# Patient Record
Sex: Male | Born: 1975 | Race: White | Hispanic: No | Smoking: Former smoker
Health system: Southern US, Community
[De-identification: ages and names within clinical notes are randomized; demographics above are authoritative.]

## PROBLEM LIST (undated history)

## (undated) DIAGNOSIS — I10 Essential (primary) hypertension: Secondary | ICD-10-CM

## (undated) DIAGNOSIS — E785 Hyperlipidemia, unspecified: Secondary | ICD-10-CM

## (undated) DIAGNOSIS — R7303 Prediabetes: Secondary | ICD-10-CM

## (undated) DIAGNOSIS — K219 Gastro-esophageal reflux disease without esophagitis: Secondary | ICD-10-CM

## (undated) DIAGNOSIS — F32A Depression, unspecified: Secondary | ICD-10-CM

## (undated) DIAGNOSIS — F1910 Other psychoactive substance abuse, uncomplicated: Secondary | ICD-10-CM

## (undated) DIAGNOSIS — K921 Melena: Secondary | ICD-10-CM

## (undated) DIAGNOSIS — F419 Anxiety disorder, unspecified: Secondary | ICD-10-CM

## (undated) HISTORY — PX: APPENDECTOMY: SHX54

## (undated) HISTORY — DX: Essential (primary) hypertension: I10

## (undated) HISTORY — DX: Melena: K92.1

## (undated) HISTORY — PX: BOWEL RESECTION: SHX1257

## (undated) HISTORY — PX: HERNIA REPAIR: SHX51

## (undated) HISTORY — DX: Depression, unspecified: F32.A

## (undated) HISTORY — DX: Hyperlipidemia, unspecified: E78.5

## (undated) HISTORY — DX: Gastro-esophageal reflux disease without esophagitis: K21.9

---

## 2008-11-29 LAB — HM HEPATITIS C SCREENING LAB: HM Hepatitis Screen: NEGATIVE

## 2013-10-07 ENCOUNTER — Emergency Department: Payer: Self-pay | Admitting: Emergency Medicine

## 2013-10-07 LAB — COMPREHENSIVE METABOLIC PANEL
ALBUMIN: 4 g/dL (ref 3.4–5.0)
ALT: 33 U/L (ref 12–78)
AST: 35 U/L (ref 15–37)
Alkaline Phosphatase: 116 U/L
Anion Gap: 3 — ABNORMAL LOW (ref 7–16)
BILIRUBIN TOTAL: 0.3 mg/dL (ref 0.2–1.0)
BUN: 14 mg/dL (ref 7–18)
Calcium, Total: 9.5 mg/dL (ref 8.5–10.1)
Chloride: 103 mmol/L (ref 98–107)
Co2: 29 mmol/L (ref 21–32)
Creatinine: 0.97 mg/dL (ref 0.60–1.30)
EGFR (African American): >60
Glucose: 116 mg/dL — ABNORMAL HIGH (ref 65–99)
Osmolality: 272 (ref 275–301)
Potassium: 3.3 mmol/L — ABNORMAL LOW (ref 3.5–5.1)
Sodium: 135 mmol/L — ABNORMAL LOW (ref 136–145)
Total Protein: 9 g/dL — ABNORMAL HIGH (ref 6.4–8.2)

## 2013-10-07 LAB — URINALYSIS, COMPLETE
Blood: NEGATIVE
GLUCOSE, UR: NEGATIVE mg/dL (ref 0–75)
Ketone: NEGATIVE
Leukocyte Esterase: NEGATIVE
Nitrite: NEGATIVE
PH: 5 (ref 4.5–8.0)
Protein: 30
RBC,UR: 4 /HPF (ref 0–5)
Specific Gravity: 1.041 (ref 1.003–1.030)

## 2013-10-07 LAB — CBC WITH DIFFERENTIAL/PLATELET
BASOS ABS: 0.1 10*3/uL (ref 0.0–0.1)
Basophil %: 0.5 %
EOS PCT: 0.4 %
Eosinophil #: 0.1 10*3/uL (ref 0.0–0.7)
HCT: 46.6 % (ref 40.0–52.0)
HGB: 15.5 g/dL (ref 13.0–18.0)
Lymphocyte #: 4.8 10*3/uL — ABNORMAL HIGH (ref 1.0–3.6)
Lymphocyte %: 37.3 %
MCH: 27.2 pg (ref 26.0–34.0)
MCHC: 33.2 g/dL (ref 32.0–36.0)
MCV: 82 fL (ref 80–100)
MONO ABS: 0.8 x10 3/mm (ref 0.2–1.0)
MONOS PCT: 6.5 %
Neutrophil #: 7.1 10*3/uL — ABNORMAL HIGH (ref 1.4–6.5)
Neutrophil %: 55.3 %
Platelet: 369 10*3/uL (ref 150–440)
RBC: 5.68 10*6/uL (ref 4.40–5.90)
RDW: 13.6 % (ref 11.5–14.5)
WBC: 12.9 10*3/uL — ABNORMAL HIGH (ref 3.8–10.6)

## 2013-10-07 LAB — LIPASE, BLOOD: Lipase: 79 U/L (ref 73–393)

## 2013-10-14 ENCOUNTER — Emergency Department: Payer: Self-pay | Admitting: Emergency Medicine

## 2013-10-14 LAB — LIPASE, BLOOD: LIPASE: 218 U/L (ref 73–393)

## 2013-10-14 LAB — URINALYSIS, COMPLETE
BACTERIA: NONE SEEN
Bilirubin,UR: NEGATIVE
Blood: NEGATIVE
GLUCOSE, UR: NEGATIVE mg/dL (ref 0–75)
KETONE: NEGATIVE
LEUKOCYTE ESTERASE: NEGATIVE
Nitrite: NEGATIVE
PH: 7 (ref 4.5–8.0)
Specific Gravity: 1.027 (ref 1.003–1.030)
WBC UR: 12 /HPF (ref 0–5)

## 2013-10-14 LAB — COMPREHENSIVE METABOLIC PANEL
Albumin: 3.9 g/dL (ref 3.4–5.0)
Alkaline Phosphatase: 111 U/L
Anion Gap: 2 — ABNORMAL LOW (ref 7–16)
BUN: 15 mg/dL (ref 7–18)
Bilirubin,Total: 0.3 mg/dL (ref 0.2–1.0)
CHLORIDE: 105 mmol/L (ref 98–107)
CREATININE: 1.04 mg/dL (ref 0.60–1.30)
Calcium, Total: 9.2 mg/dL (ref 8.5–10.1)
Co2: 30 mmol/L (ref 21–32)
EGFR (African American): >60
GLUCOSE: 116 mg/dL — AB (ref 65–99)
Osmolality: 276 (ref 275–301)
Potassium: 3.7 mmol/L (ref 3.5–5.1)
SGOT(AST): 22 U/L (ref 15–37)
SGPT (ALT): 27 U/L (ref 12–78)
Sodium: 137 mmol/L (ref 136–145)
Total Protein: 7.9 g/dL (ref 6.4–8.2)

## 2013-10-14 LAB — CBC WITH DIFFERENTIAL/PLATELET
BASOS ABS: 0.1 10*3/uL (ref 0.0–0.1)
Basophil %: 0.4 %
Eosinophil #: 0.1 10*3/uL (ref 0.0–0.7)
Eosinophil %: 0.4 %
HCT: 43.6 % (ref 40.0–52.0)
HGB: 14.5 g/dL (ref 13.0–18.0)
LYMPHS PCT: 26.3 %
Lymphocyte #: 3.9 10*3/uL — ABNORMAL HIGH (ref 1.0–3.6)
MCH: 27.6 pg (ref 26.0–34.0)
MCHC: 33.2 g/dL (ref 32.0–36.0)
MCV: 83 fL (ref 80–100)
Monocyte #: 0.9 x10 3/mm (ref 0.2–1.0)
Monocyte %: 6.2 %
Neutrophil #: 10 10*3/uL — ABNORMAL HIGH (ref 1.4–6.5)
Neutrophil %: 66.7 %
PLATELETS: 343 10*3/uL (ref 150–440)
RBC: 5.25 10*6/uL (ref 4.40–5.90)
RDW: 13.7 % (ref 11.5–14.5)
WBC: 15 10*3/uL — ABNORMAL HIGH (ref 3.8–10.6)

## 2013-10-25 ENCOUNTER — Ambulatory Visit: Payer: Self-pay | Admitting: Pain Medicine

## 2014-04-21 ENCOUNTER — Emergency Department: Payer: Self-pay | Admitting: Emergency Medicine

## 2014-04-21 LAB — CBC WITH DIFFERENTIAL/PLATELET
BASOS PCT: 0.7 %
Basophil #: 0.1 10*3/uL (ref 0.0–0.1)
EOS ABS: 0 10*3/uL (ref 0.0–0.7)
Eosinophil %: 0.5 %
HCT: 46.1 % (ref 40.0–52.0)
HGB: 15.3 g/dL (ref 13.0–18.0)
Lymphocyte #: 2.5 10*3/uL (ref 1.0–3.6)
Lymphocyte %: 24.4 %
MCH: 28.6 pg (ref 26.0–34.0)
MCHC: 33.1 g/dL (ref 32.0–36.0)
MCV: 86 fL (ref 80–100)
Monocyte #: 0.7 x10 3/mm (ref 0.2–1.0)
Monocyte %: 6.4 %
NEUTROS PCT: 68 %
Neutrophil #: 7.1 10*3/uL — ABNORMAL HIGH (ref 1.4–6.5)
Platelet: 339 10*3/uL (ref 150–440)
RBC: 5.34 10*6/uL (ref 4.40–5.90)
RDW: 14.1 % (ref 11.5–14.5)
WBC: 10.4 10*3/uL (ref 3.8–10.6)

## 2014-04-21 LAB — BASIC METABOLIC PANEL
ANION GAP: 7 (ref 7–16)
BUN: 6 mg/dL — AB (ref 7–18)
CALCIUM: 8.3 mg/dL — AB (ref 8.5–10.1)
CO2: 27 mmol/L (ref 21–32)
CREATININE: 0.95 mg/dL (ref 0.60–1.30)
Chloride: 108 mmol/L — ABNORMAL HIGH (ref 98–107)
EGFR (Non-African Amer.): >60
Glucose: 103 mg/dL — ABNORMAL HIGH (ref 65–99)
OSMOLALITY: 281 (ref 275–301)
Potassium: 3.5 mmol/L (ref 3.5–5.1)
Sodium: 142 mmol/L (ref 136–145)

## 2014-05-20 ENCOUNTER — Emergency Department: Payer: Self-pay | Admitting: Emergency Medicine

## 2014-05-29 ENCOUNTER — Ambulatory Visit: Payer: Self-pay | Admitting: Internal Medicine

## 2015-08-04 IMAGING — CR DG CHEST 2V
1 series · 2 of 2 positions shown · non-contrast
Comparison: 12/04/1995 by report only

CLINICAL DATA: neck pain over last few days worse this am unable to
move neck

EXAM:
CHEST - 2 VIEW

[Series 1: w chest pa · 0.14mm/px · 2 of 2 slices shown]
[im 1/2]
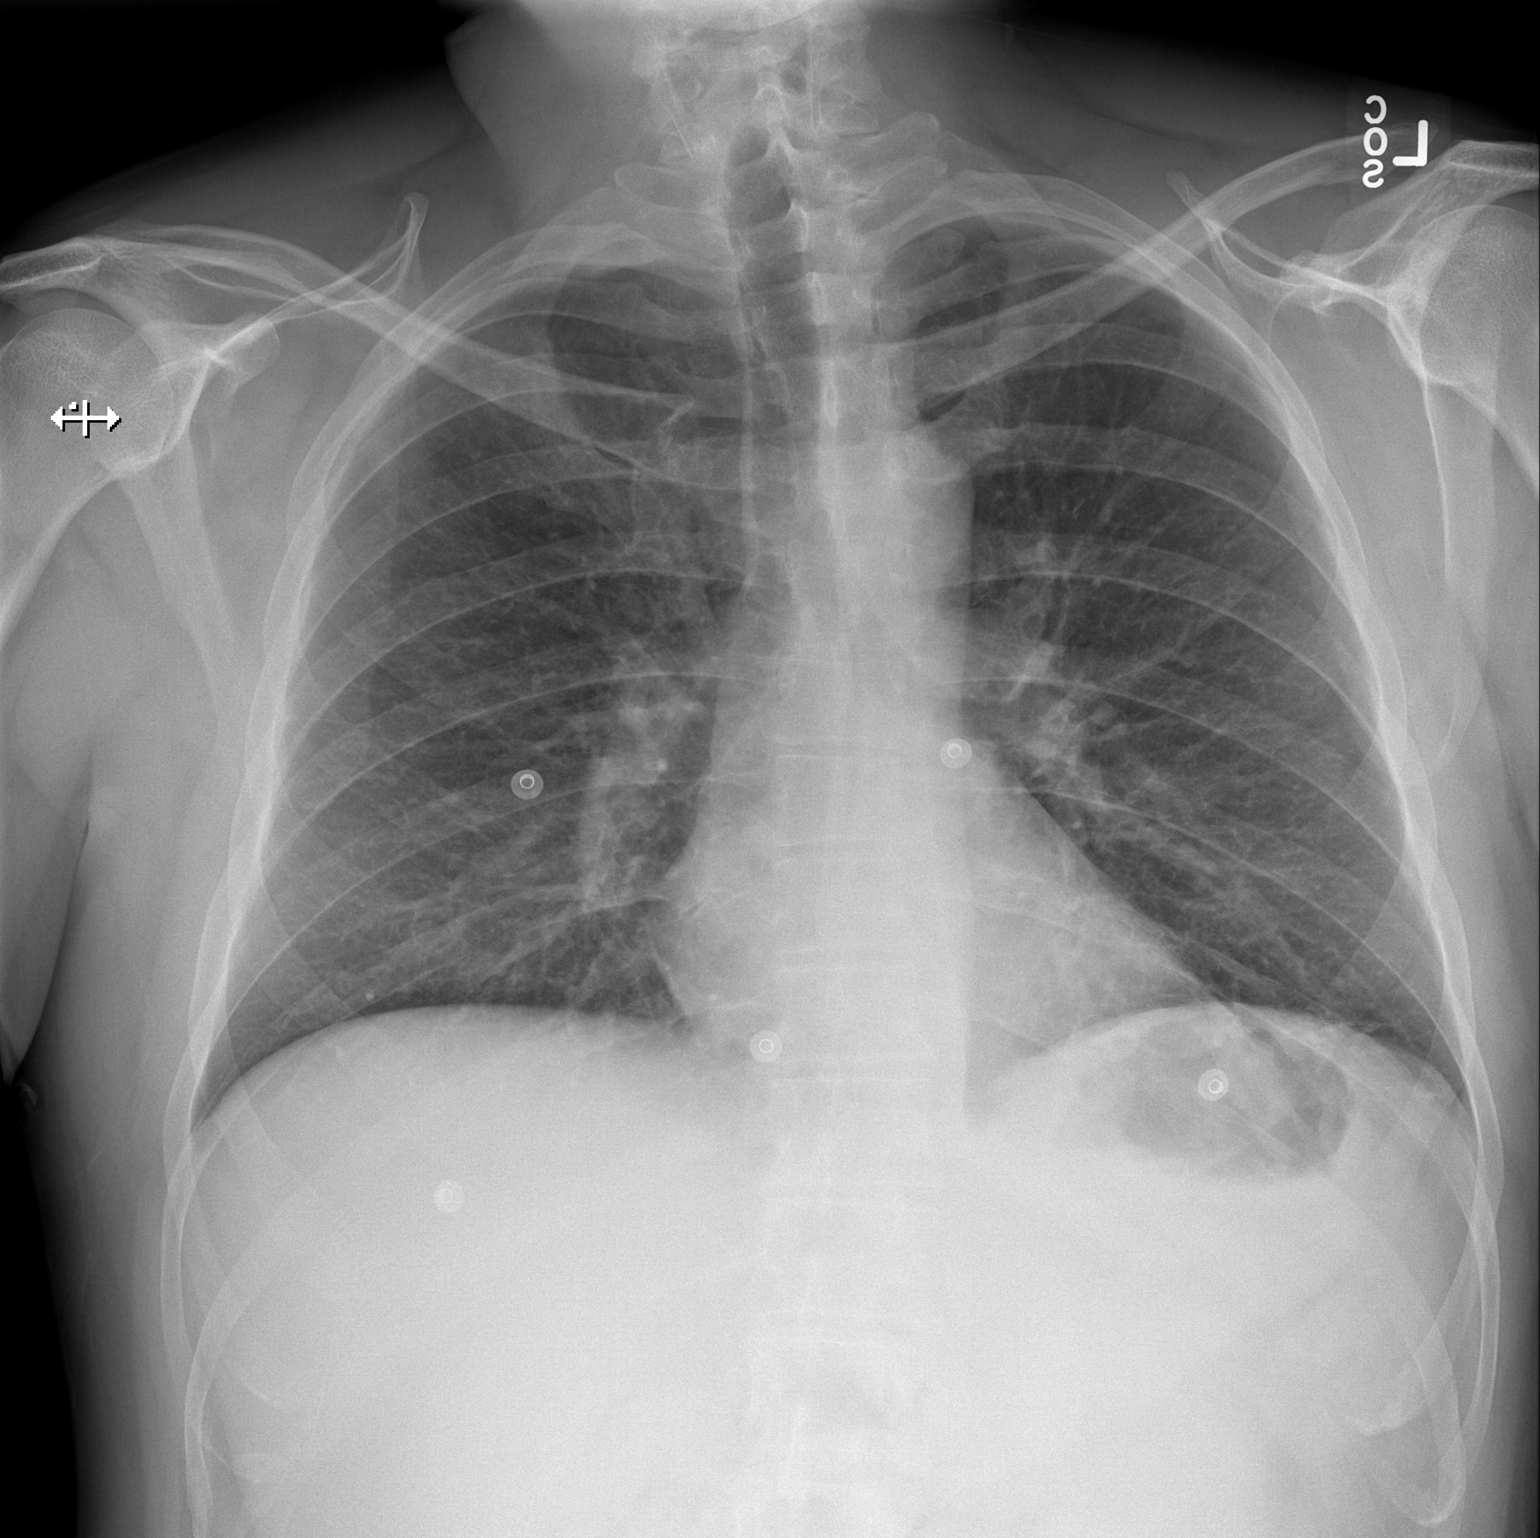
[im 2/2]
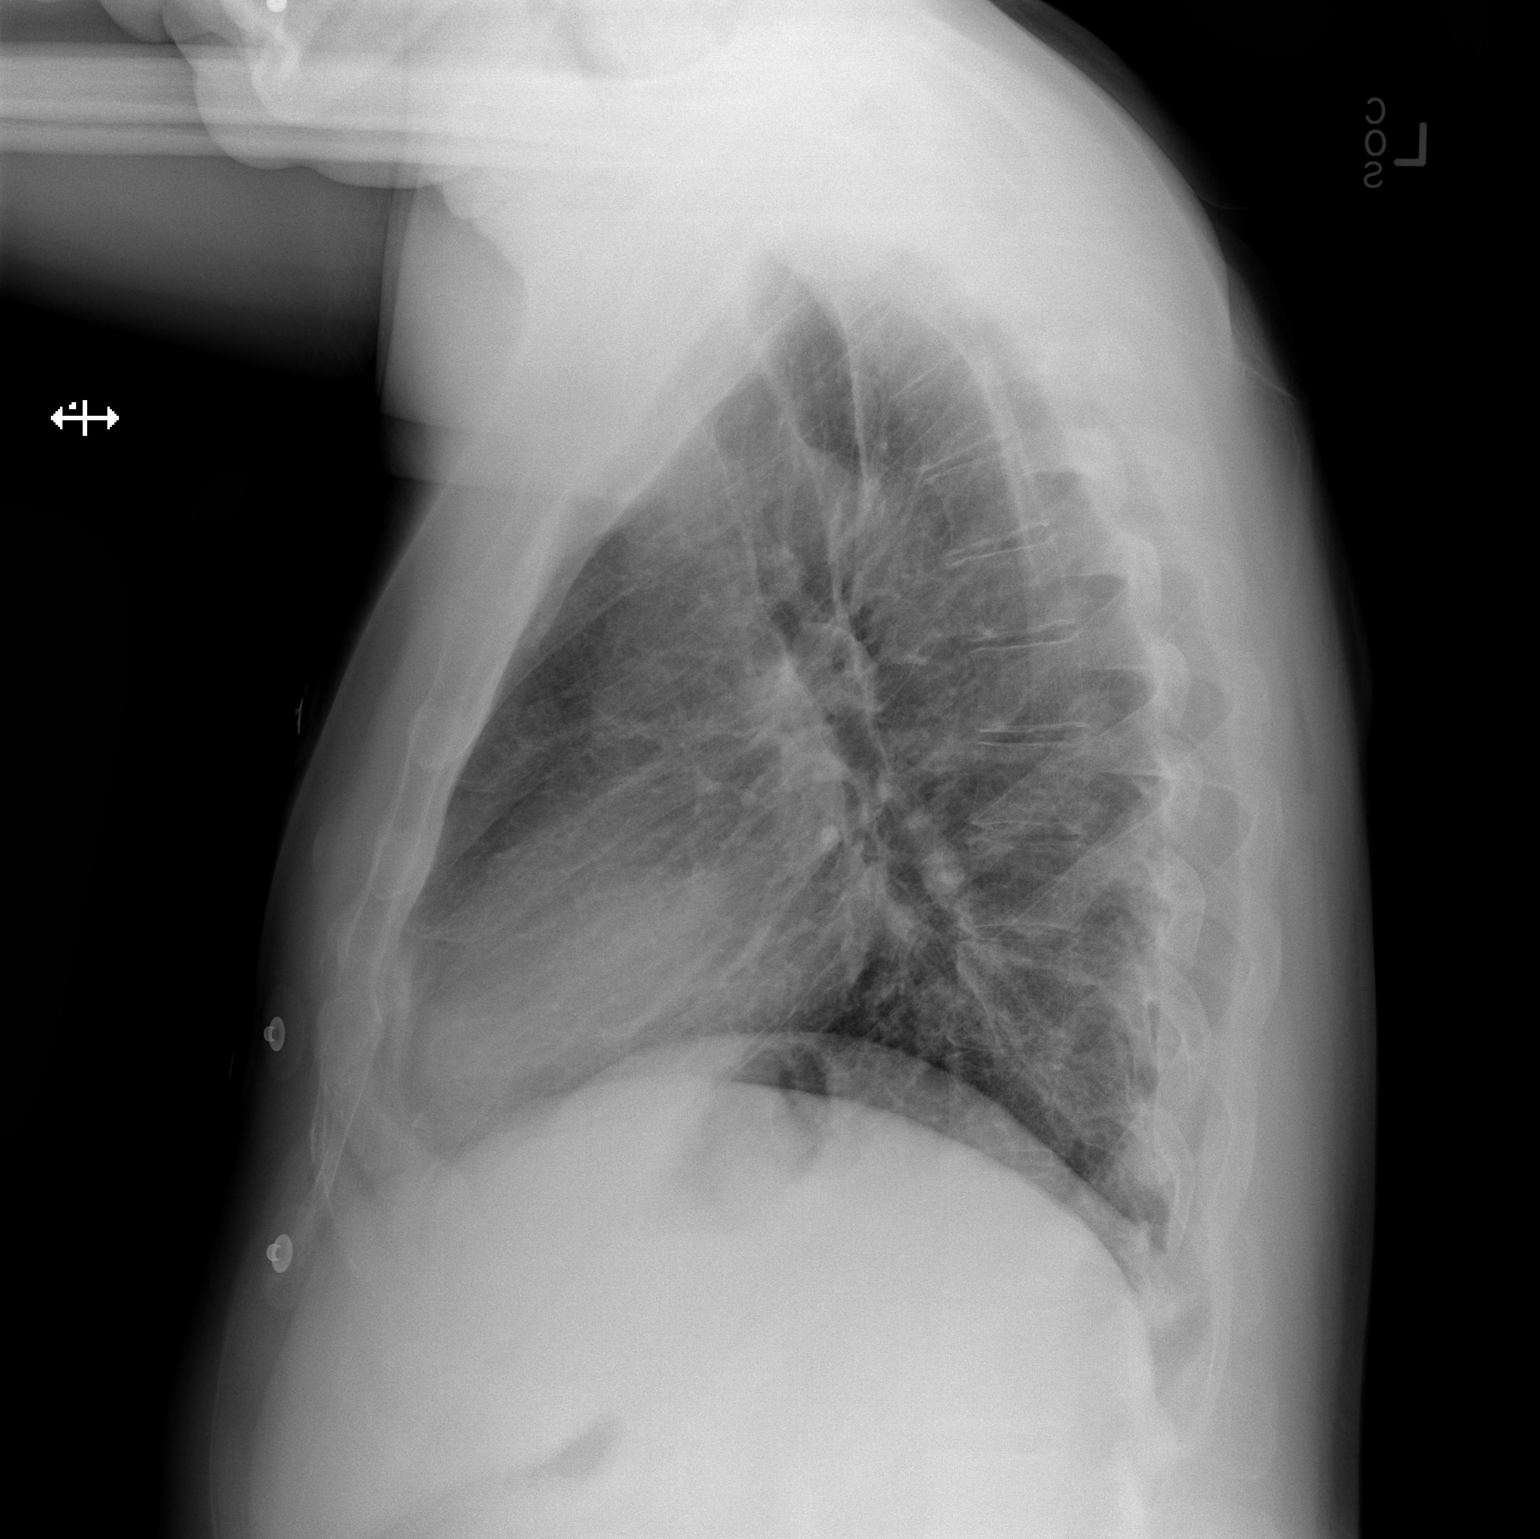

[2 of 2 positions shown; findings below may reference images not displayed]

FINDINGS: Mildly prominent bibasilar interstitial opacities. No focal
infiltrate or overt edema. Heart size normal. No effusion. Regional
bones unremarkable.
IMPRESSION: No acute disease

## 2015-08-04 IMAGING — CT CT CERVICAL SPINE WITHOUT CONTRAST
3 of 4 series · 14 of 34 positions shown, 17 images · non-contrast
Comparison: None.

CLINICAL DATA: Patient c/o pain and inability to move neck this
morning. Reports pain has been going on for a few days, unsure of
any injury. C-collar placed due to inability to move neck and
initial report of injury. pt could not take out tongue ring

EXAM:
CT CERVICAL SPINE WITHOUT CONTRAST
TECHNIQUE: Multidetector CT imaging of the cervical spine was performed without
intravenous contrast. Multiplanar CT image reconstructions were also
generated.

[Series 5: sag bone · sagittal · 0.30mm/px · 5 of 74 slices shown, 6 images]
[im 25/74  bone]
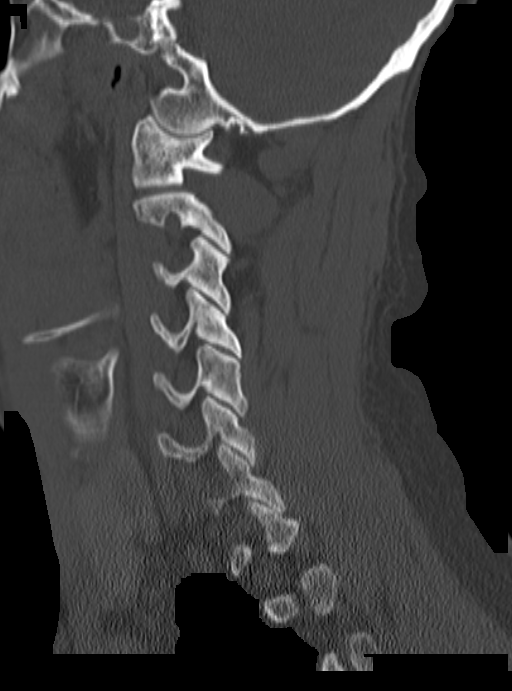
[im 31/74  bone]
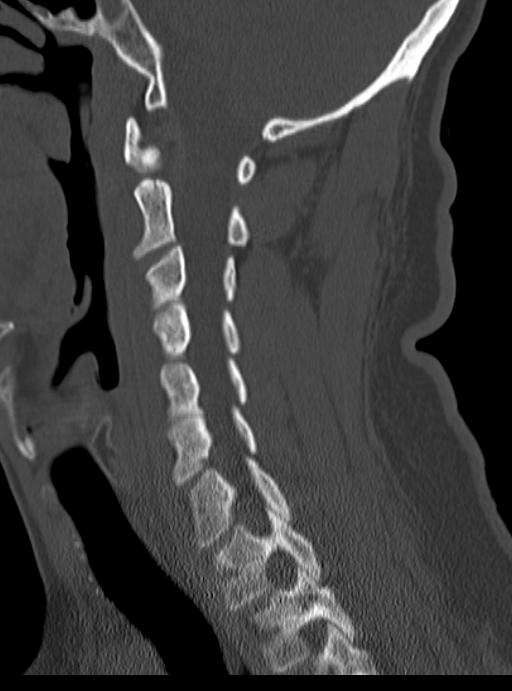
[im 37/74  soft-tissue]
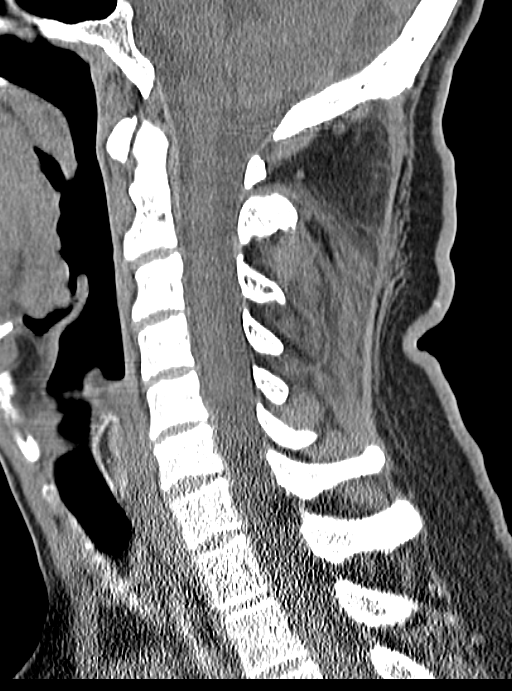
[im 37/74  bone]
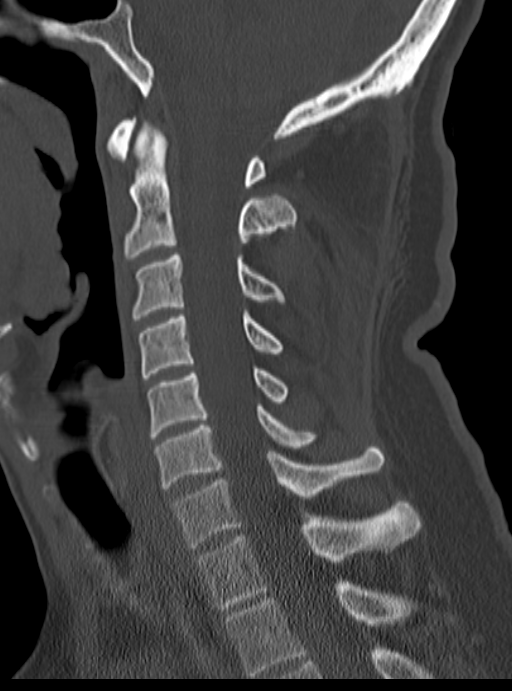
[im 43/74  bone]
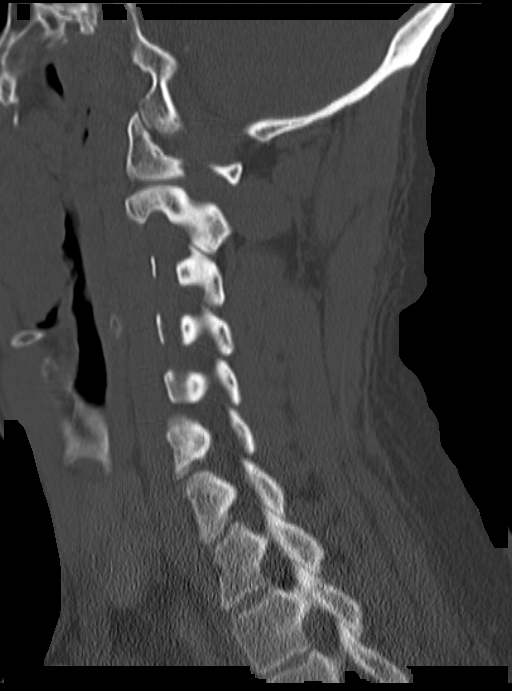
[im 49/74  bone]
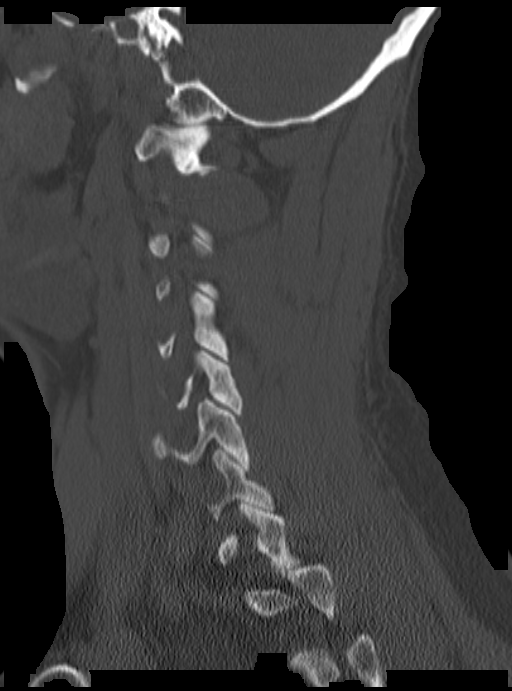

[Series 6: cor bone · coronal · 0.32mm/px · 3 of 75 slices shown]
[im 15/75  bone]
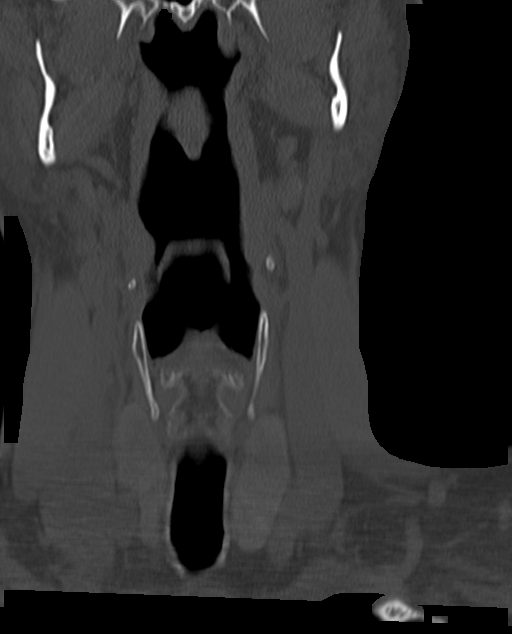
[im 30/75  bone]
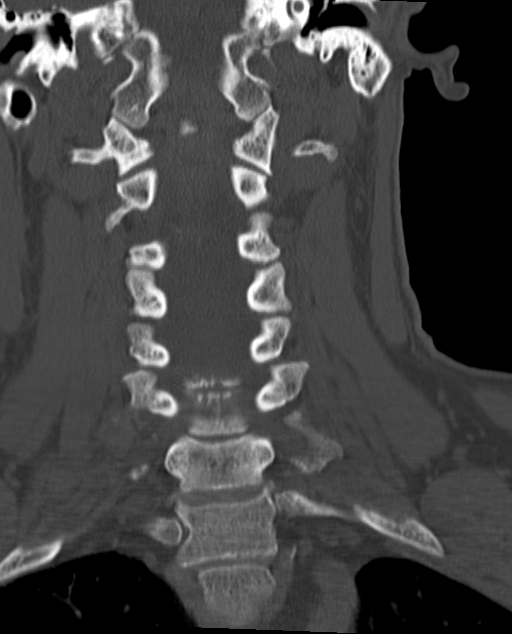
[im 45/75  bone]
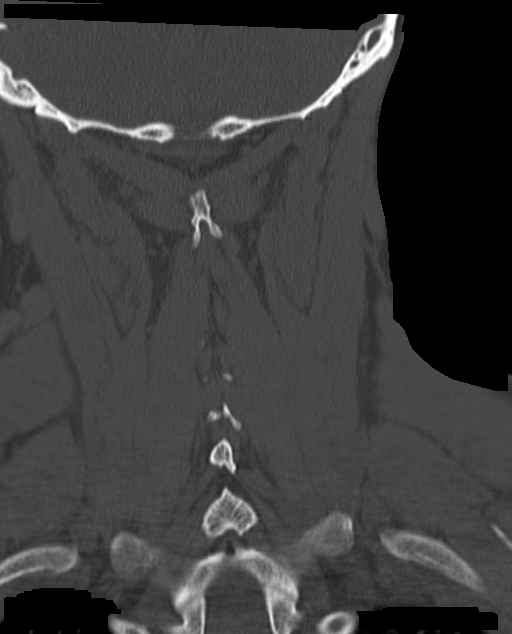

[Series 7: orthogonal axials · axial · 0.29mm/px · z∈[-216,-51]mm · 6 of 118 slices shown, 8 images]
[im 17/118  soft-tissue]
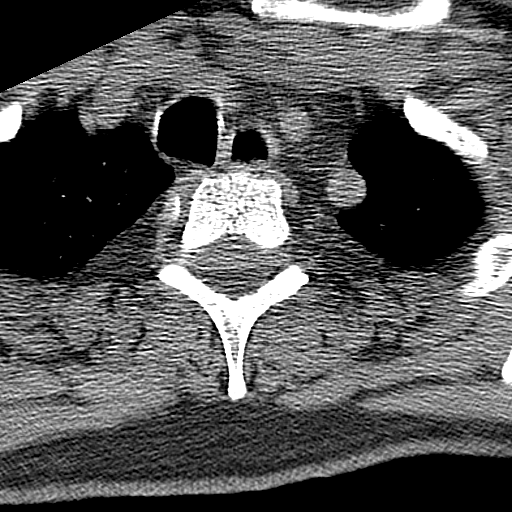
[im 17/118  bone]
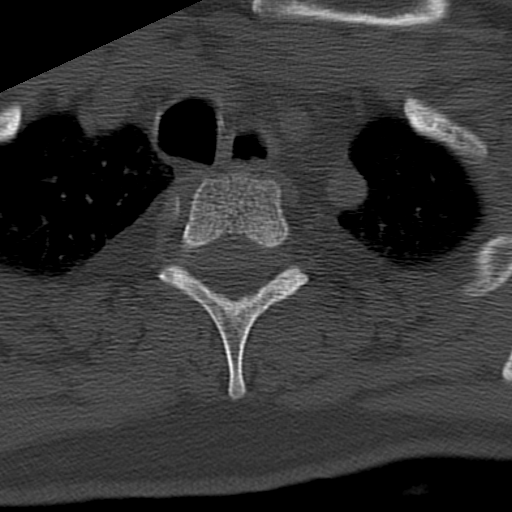
[im 34/118  bone]
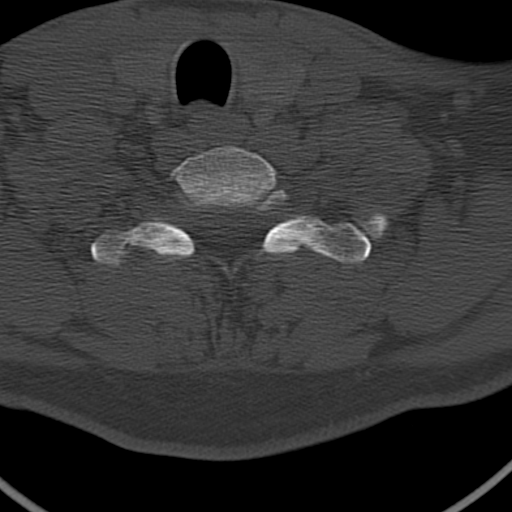
[im 51/118  bone]
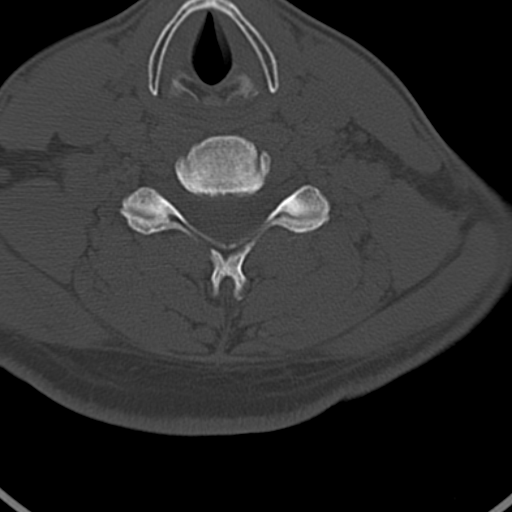
[im 67/118  bone]
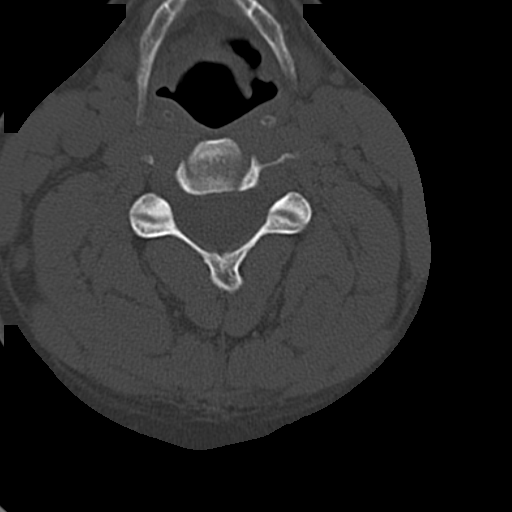
[im 84/118  soft-tissue]
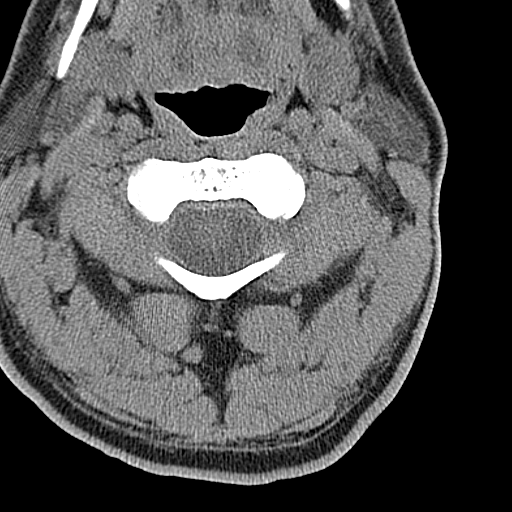
[im 84/118  bone]
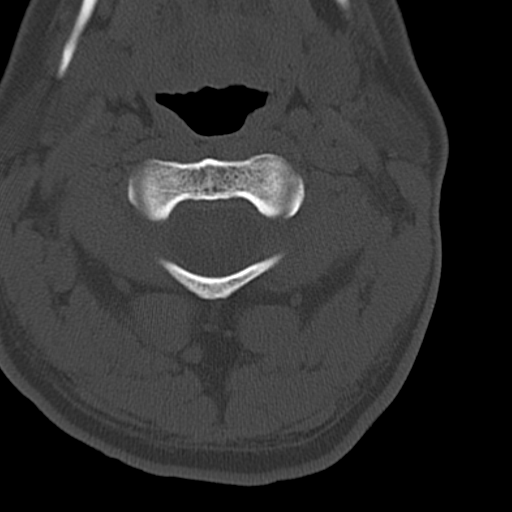
[im 101/118  bone]
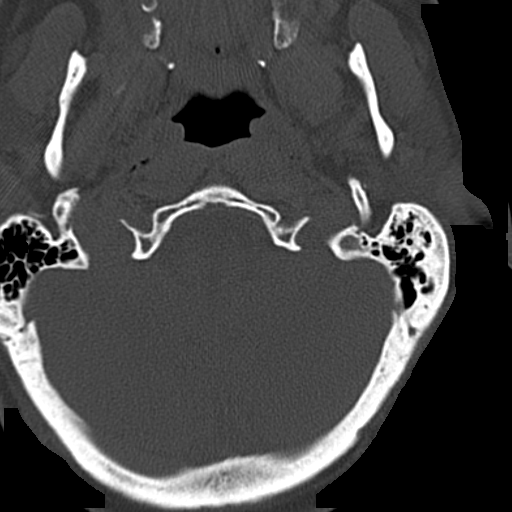

[14 of 34 positions shown; findings below may reference images not displayed]

FINDINGS: Normal alignment. Vertebral body height maintained. Mild narrowing
of the C5-6 disc with small bulge. Facets are seated. Negative for
fracture. Visualized lung apices clear. No significant osseous
degenerative change.
IMPRESSION: 1. Negative for fracture or other acute abnormality.
2. Early degenerative disc disease C5-6.

## 2018-06-15 LAB — HM COLONOSCOPY

## 2020-07-17 LAB — LIPID PANEL
Cholesterol: 170 (ref 0–200)
HDL: 38 (ref 35–70)
LDL Cholesterol: 111
Triglycerides: 103 (ref 40–160)

## 2022-04-27 ENCOUNTER — Other Ambulatory Visit: Payer: Self-pay

## 2022-04-27 ENCOUNTER — Encounter (HOSPITAL_COMMUNITY): Payer: Self-pay

## 2022-04-27 ENCOUNTER — Emergency Department (HOSPITAL_COMMUNITY): Payer: Medicare Other

## 2022-04-27 ENCOUNTER — Emergency Department (HOSPITAL_COMMUNITY)
Admission: EM | Admit: 2022-04-27 | Discharge: 2022-04-27 | Disposition: A | Discharge status: Home / Self Care | Payer: Medicare Other | Attending: Emergency Medicine | Admitting: Emergency Medicine

## 2022-04-27 DIAGNOSIS — R112 Nausea with vomiting, unspecified: Secondary | ICD-10-CM | POA: Insufficient documentation

## 2022-04-27 DIAGNOSIS — R824 Acetonuria: Secondary | ICD-10-CM | POA: Insufficient documentation

## 2022-04-27 DIAGNOSIS — N281 Cyst of kidney, acquired: Secondary | ICD-10-CM

## 2022-04-27 DIAGNOSIS — R1033 Periumbilical pain: Secondary | ICD-10-CM | POA: Insufficient documentation

## 2022-04-27 DIAGNOSIS — D72829 Elevated white blood cell count, unspecified: Secondary | ICD-10-CM | POA: Insufficient documentation

## 2022-04-27 DIAGNOSIS — E876 Hypokalemia: Secondary | ICD-10-CM | POA: Insufficient documentation

## 2022-04-27 DIAGNOSIS — R109 Unspecified abdominal pain: Secondary | ICD-10-CM | POA: Diagnosis present

## 2022-04-27 DIAGNOSIS — N433 Hydrocele, unspecified: Secondary | ICD-10-CM

## 2022-04-27 LAB — CBC WITH DIFFERENTIAL/PLATELET
Abs Immature Granulocytes: 0.05 10*3/uL (ref 0.00–0.07)
Basophils Absolute: 0 10*3/uL (ref 0.0–0.1)
Basophils Relative: 0 %
Eosinophils Absolute: 0 10*3/uL (ref 0.0–0.5)
Eosinophils Relative: 0 %
HCT: 49 % (ref 39.0–52.0)
Hemoglobin: 16.4 g/dL (ref 13.0–17.0)
Immature Granulocytes: 0 %
Lymphocytes Relative: 15 %
Lymphs Abs: 2.1 10*3/uL (ref 0.7–4.0)
MCH: 28.6 pg (ref 26.0–34.0)
MCHC: 33.5 g/dL (ref 30.0–36.0)
MCV: 85.5 fL (ref 80.0–100.0)
Monocytes Absolute: 0.5 10*3/uL (ref 0.1–1.0)
Monocytes Relative: 4 %
Neutro Abs: 11.2 10*3/uL — ABNORMAL HIGH (ref 1.7–7.7)
Neutrophils Relative %: 81 %
Platelets: 336 10*3/uL (ref 150–400)
RBC: 5.73 MIL/uL (ref 4.22–5.81)
RDW: 12.1 % (ref 11.5–15.5)
WBC: 13.9 10*3/uL — ABNORMAL HIGH (ref 4.0–10.5)
nRBC: 0 % (ref 0.0–0.2)

## 2022-04-27 LAB — COMPREHENSIVE METABOLIC PANEL
ALT: 31 U/L (ref 0–44)
AST: 36 U/L (ref 15–41)
Albumin: 4.3 g/dL (ref 3.5–5.0)
Alkaline Phosphatase: 81 U/L (ref 38–126)
Anion gap: 14 (ref 5–15)
BUN: 14 mg/dL (ref 6–20)
CO2: 25 mmol/L (ref 22–32)
Calcium: 9.8 mg/dL (ref 8.9–10.3)
Chloride: 98 mmol/L (ref 98–111)
Creatinine, Ser: 0.97 mg/dL (ref 0.61–1.24)
GFR, Estimated: >60 mL/min (ref >60)
Glucose, Bld: 113 mg/dL — ABNORMAL HIGH (ref 70–99)
Potassium: 3.3 mmol/L — ABNORMAL LOW (ref 3.5–5.1)
Sodium: 137 mmol/L (ref 135–145)
Total Bilirubin: 1 mg/dL (ref 0.3–1.2)
Total Protein: 8.1 g/dL (ref 6.5–8.1)

## 2022-04-27 LAB — URINALYSIS, ROUTINE W REFLEX MICROSCOPIC
Bacteria, UA: NONE SEEN
Bilirubin Urine: NEGATIVE
Glucose, UA: NEGATIVE mg/dL
Ketones, ur: 80 mg/dL — AB
Leukocytes,Ua: NEGATIVE
Nitrite: NEGATIVE
Protein, ur: NEGATIVE mg/dL
Specific Gravity, Urine: 1.017 (ref 1.005–1.030)
pH: 5 (ref 5.0–8.0)

## 2022-04-27 LAB — LIPASE, BLOOD: Lipase: 22 U/L (ref 11–51)

## 2022-04-27 LAB — MAGNESIUM: Magnesium: 2 mg/dL (ref 1.7–2.4)

## 2022-04-27 MED ORDER — POTASSIUM CHLORIDE CRYS ER 20 MEQ PO TBCR: 20.0000 meq | EXTENDED_RELEASE_TABLET | Freq: Two times a day (BID) | ORAL | 0 refills | Status: DC

## 2022-04-27 MED ORDER — POTASSIUM CHLORIDE CRYS ER 20 MEQ PO TBCR
40.0000 meq | EXTENDED_RELEASE_TABLET | Freq: Once | ORAL | Status: AC
  Administered 2022-04-27: 40 meq via ORAL
  Filled 2022-04-27: qty 2

## 2022-04-27 MED ORDER — PROMETHAZINE HCL 25 MG PO TABS: 25.0000 mg | ORAL_TABLET | Freq: Four times a day (QID) | ORAL | 0 refills | Status: DC | PRN

## 2022-04-27 MED ORDER — OXYCODONE-ACETAMINOPHEN 5-325 MG PO TABS: 1.0000 | ORAL_TABLET | Freq: Four times a day (QID) | ORAL | 0 refills | Status: DC | PRN

## 2022-04-27 MED ORDER — MORPHINE SULFATE (PF) 4 MG/ML IV SOLN
4.0000 mg | Freq: Once | INTRAVENOUS | Status: AC
  Administered 2022-04-27: 4 mg via INTRAVENOUS
  Filled 2022-04-27: qty 1

## 2022-04-27 MED ORDER — OXYCODONE-ACETAMINOPHEN 5-325 MG PO TABS
1.0000 | ORAL_TABLET | Freq: Once | ORAL | Status: AC
  Administered 2022-04-27: 1 via ORAL
  Filled 2022-04-27: qty 1

## 2022-04-27 MED ORDER — SODIUM CHLORIDE 0.9 % IV BOLUS
500.0000 mL | Freq: Once | INTRAVENOUS | Status: AC
  Administered 2022-04-27: 500 mL via INTRAVENOUS

## 2022-04-27 NOTE — ED Provider Triage Note (Signed)
Emergency Medicine Provider Triage Evaluation Note  Wyatt Murray , a 46 y.o. male  was evaluated in triage.  Pt complains of abdominal pain that began approximately 3 days ago.  States that symptoms can insidiously upon awakening.  He notes exacerbation of symptoms with any movement denies any alleviating symptoms.  He has noted 2 episodes of vomiting and constipation nausea since symptom onset.  He has not had a bowel movement in 2 days but states that is normal for him.  Has a reported history of multiple abdominal surgeries including 2 partial bowel resections and a few hernia repairs.  He denies hematochezia, melena, hematemesis, fever, chest pain, shortness of breath, urinary symptoms, back/flank pain.  Review of Systems  Positive: See above Negative:   Physical Exam  BP (!) 142/108 (BP Location: Right Arm)   Pulse (!) 113   Temp 98.1 F (36.7 C) (Oral)   Resp 18   Ht 6' (1.829 m)   Wt 86.2 kg   SpO2 98%   BMI 25.77 kg/m  Gen:   Awake, no distress   Resp:  Normal effort  MSK:   Moves extremities without difficulty  Other:  Epigastric/periumbilical tenderness.  No CVA tenderness bilaterally.  Medical Decision Making  Medically screening exam initiated at 9:34 AM.  Appropriate orders placed.  LANI MENDIOLA was informed that the remainder of the evaluation will be completed by another provider, this initial triage assessment does not replace that evaluation, and the importance of remaining in the ED until their evaluation is complete.    Wilnette Kales, [place] 04/27/22 [phone]

## 2022-04-27 NOTE — ED Triage Notes (Signed)
Pt arrived POV from home c/o abdominal pain more on the left side of his abdomen that is tender to palpation. Pt endorses N/V as well.

## 2022-04-27 NOTE — Discharge Instructions (Addendum)
Note that your work-up today was overall negative for direct cause of your abdominal pain.  I suspect postsurgical changes including adhesions given area where you are most tender.  I refill antiemetic to use as needed as well as give a short course of pain medication to use as needed.  Your potassium was also low while emergency department I will prescribe 5 days worth of potassium to take daily.  Keep your upcoming appointment with your general surgeon for reevaluation of your symptoms.  Please do not hesitate to return to the emergency department if the worrisome signs and symptoms we discussed become apparent.

## 2022-04-27 NOTE — ED Provider Notes (Signed)
[facility] EMERGENCY DEPARTMENT Provider Note   CSN: [id number] Arrival date & time: 04/27/22  [id number]     History  Chief Complaint  Patient presents with   Abdominal Pain    Wyatt Murray is a 46 y.o. male.   Abdominal Pain  46 year old male presents emergency department with complaints of abdominal pain that began approximately 3 days ago.  States that symptoms can insidiously upon awakening.  He notes exacerbation of symptoms with any movement denies any alleviating symptoms.  He has noted 2 episodes of vomiting and constipation nausea since symptom onset.  He has not had a bowel movement in 2 days but states that is normal for him.  Has a reported history of multiple abdominal surgeries including 2 partial bowel resections and a few hernia repairs.  He denies hematochezia, melena, hematemesis, fever, chest pain, shortness of breath, urinary symptoms, back/flank pain.  Home Medications Prior to Admission medications   Medication Sig Start Date End Date Taking? Authorizing Provider  oxyCODONE-acetaminophen (PERCOCET/ROXICET) 5-325 MG tablet Take 1 tablet by mouth every 6 (six) hours as needed for severe pain. 04/27/22  Yes Dion Saucier A, PA  potassium chloride SA (KLOR-CON M) 20 MEQ tablet Take 1 tablet (20 mEq total) by mouth 2 (two) times daily. 04/27/22  Yes Dion Saucier A, PA  promethazine (PHENERGAN) 25 MG tablet Take 1 tablet (25 mg total) by mouth every 6 (six) hours as needed for nausea or vomiting. 04/27/22  Yes Dion Saucier A, PA      Allergies    Iodinated contrast media, Iodine, Midazolam, Shellfish allergy, and Atorvastatin    Review of Systems   Review of Systems  Gastrointestinal:  Positive for abdominal pain.    Physical Exam Updated Vital Signs BP 115/78   Pulse 79   Temp 98 F (36.7 C) (Oral)   Resp 17   Ht 6' (1.829 m)   Wt 86.2 kg   SpO2 98%   BMI 25.77 kg/m  Physical Exam Vitals and nursing note reviewed.   Constitutional:      General: He is not in acute distress.    Appearance: He is well-developed.  HENT:     Head: Normocephalic and atraumatic.  Eyes:     Conjunctiva/sclera: Conjunctivae normal.  Cardiovascular:     Rate and Rhythm: Normal rate and regular rhythm.     Heart sounds: No murmur heard. Pulmonary:     Effort: Pulmonary effort is normal. No respiratory distress.     Breath sounds: Normal breath sounds.  Abdominal:     Palpations: Abdomen is soft.     Tenderness: There is abdominal tenderness in the periumbilical area. There is guarding. There is no right CVA tenderness, left CVA tenderness or rebound.     Comments: Midline incision on patient's abdomen nonerythematous.   Musculoskeletal:        General: No swelling.     Cervical back: Neck supple.  Skin:    General: Skin is warm and dry.     Capillary Refill: Capillary refill takes less than 2 seconds.  Neurological:     Mental Status: He is alert.  Psychiatric:        Mood and Affect: Mood normal.     ED Results / Procedures / Treatments   Labs (all labs ordered are listed, but only abnormal results are displayed) Labs Reviewed  CBC WITH DIFFERENTIAL/PLATELET - Abnormal; Notable for the following components:      Result Value   WBC  13.9 (*)    Neutro Abs 11.2 (*)    All other components within normal limits  COMPREHENSIVE METABOLIC PANEL - Abnormal; Notable for the following components:   Potassium 3.3 (*)    Glucose, Bld 113 (*)    All other components within normal limits  URINALYSIS, ROUTINE W REFLEX MICROSCOPIC - Abnormal; Notable for the following components:   Hgb urine dipstick MODERATE (*)    Ketones, ur 80 (*)    All other components within normal limits  LIPASE, BLOOD  MAGNESIUM    EKG None  Radiology CT Abdomen Pelvis Wo Contrast  Result Date: 04/27/2022 CLINICAL DATA:  Abdominal pain worse on the left side with tenderness to palpation. Nausea, vomiting. EXAM: CT ABDOMEN AND PELVIS  WITHOUT CONTRAST TECHNIQUE: Multidetector CT imaging of the abdomen and pelvis was performed following the standard protocol without IV contrast. RADIATION DOSE REDUCTION: This exam was performed according to the departmental dose-optimization program which includes automated exposure control, adjustment of the mA and/or kV according to patient size and/or use of iterative reconstruction technique. COMPARISON:  CT abdomen/pelvis 10/14/2013 FINDINGS: Lower chest: The lung bases are clear. The imaged heart is unremarkable. Hepatobiliary: The liver and gallbladder are unremarkable. There is no biliary ductal dilatation. Pancreas: Unremarkable. Spleen: Unremarkable. Adrenals/Urinary Tract: The adrenals are unremarkable. There is a 1.1 cm hypodense partially exophytic lesion arising from the right kidney upper pole which was not present in 2015. No other definite focal lesions are seen within the confines of noncontrast technique. There are no stones in either kidney or along the course of either ureter. There is no hydronephrosis or hydroureter. There is perinephric stranding bilaterally, nonspecific. The bladder is unremarkable. Stomach/Bowel: Stomach is unremarkable. There is no evidence of bowel obstruction. There is no abnormal bowel wall thickening or inflammatory changes postsurgical changes are noted in the small bowel in the mid abdomen, stable. The appendix is not identified, but there is no pericecal inflammatory change. Vascular/Lymphatic: The abdominal aorta is normal in course and caliber. There is no abdominopelvic lymphadenopathy. Reproductive: The prostate and seminal vesicles are unremarkable. Left larger than right scrotal hydroceles are seen. Other: There is no ascites or free air. Postsurgical changes along the anterior abdominal wall likely reflect prior hernia repair. Musculoskeletal: There is no acute osseous abnormality or suspicious osseous lesion. IMPRESSION: 1. No acute finding in the abdomen  or pelvis. 2. Partially exophytic lesion arising from the right kidney most likely reflects a benign cyst but is technically indeterminate on this study. Recommend nonemergent renal ultrasound for initial further evaluation. 3. Left larger than right scrotal hydroceles. Electronically Signed   By: Valetta Mole M.D.   On: 04/27/2022 13:59    Procedures Procedures    Medications Ordered in ED Medications  oxyCODONE-acetaminophen (PERCOCET/ROXICET) 5-325 MG per tablet 1 tablet (1 tablet Oral Given 04/27/22 1013)  morphine (PF) 4 MG/ML injection 4 mg (4 mg Intravenous Given 04/27/22 1233)  potassium chloride SA (KLOR-CON M) CR tablet 40 mEq (40 mEq Oral Given 04/27/22 1231)  sodium chloride 0.9 % bolus 500 mL (0 mLs Intravenous Stopped 04/27/22 1303)    ED Course/ Medical Decision Making/ A&P Clinical Course as of 04/27/22 1423  Tue Apr 27, 2022  1330 Patient states he had an anaphylactic allergy to the IV contrast used during prior CT imaging 15 or so years ago.  Offered premedication before contrast and he declined. [CR]    Clinical Course User Index [CR] Wilnette Kales, [place]  Medical Decision Making Amount and/or Complexity of Data Reviewed Labs: ordered. Radiology: ordered.  Risk Prescription drug management.   This patient presents to the ED for concern of abdominal pain, this involves an extensive number of treatment options, and is a complaint that carries with it a high risk of complications and morbidity.  The differential diagnosis includes The causes of generalized abdominal pain include but are not limited to AAA, mesenteric ischemia, appendicitis, diverticulitis, DKA, gastritis, gastroenteritis, AMI, nephrolithiasis, pancreatitis, peritonitis, adrenal insufficiency,lead poisoning, iron toxicity, intestinal ischemia, constipation, UTI,SBO/LBO, splenic rupture, biliary disease, IBD, IBS, PUD, or hepatitis.   Co morbidities that complicate the patient  evaluation  Prior abdominal surgery, substance abuse, chronic tobacco use.   Additional history obtained:  Additional history obtained from EMR External records from outside source obtained and reviewed including surgical procedure from 10/13/2019 indicating an elective recurrent incisional hernia repair.  Prior abdominal surgeries not visible   Lab Tests:  I Ordered, and personally interpreted labs.  The pertinent results include: Potassium 3.3 which supplemented orally with 40 mEq of potassium.  Leukocytosis of 13.9 with 11.2 neutrophil.  Lipase normal, urine significant for 80 ketones as well as moderate hemoglobin; no signs of infection or gross blood noted.   Imaging Studies ordered:  I ordered imaging studies including CT abdomen pelvis I independently visualized and interpreted imaging which showed no acute findings.  Partially exophytic lesion arising from the right kidney most likely a benign cyst.  Larger left and right scrotal hydrocele; not active complaining of testicular or scrotal pain. I agree with the radiologist interpretation  Cardiac Monitoring: / EKG:  The patient was maintained on a cardiac monitor.  I personally viewed and interpreted the cardiac monitored which showed an underlying rhythm of: Sinus rhythm   Consultations Obtained:  N/a   Problem List / ED Course / Critical interventions / Medication management  Abdominal pain I ordered medication including Percocet and morphine for pain.  500 mL of normal saline for rehydration f Reevaluation of the patient after these medicines showed that the patient improved I have reviewed the patients home medicines and have made adjustments as needed   Social Determinants of Health:  Patient has history of substance abuse but is on Suboxone currently.  Denies alcohol or illicit drug use.   Test / Admission - Considered:  Abdominal pain Vitals signs within normal range and stable throughout  visit. Laboratory/imaging studies significant for: See above Patient's symptoms was likely related to postsurgical surgical change in abdomen given area of most tenderness.  CT abdomen pelvis negative for any acute abnormality.  I suspect adhesive pathology given location of patient's symptoms as well as lack of findings on CT imaging.  Patient has close follow-up with general surgeon on August 17 who is performed prior abdominal surgeries.  Short-term of pain medication will be prescribed as well as refill on patient's antiemetic to use as needed.  Doubt appendicitis.  Doubt pancreatitis.  Doubt mesenteric ischemia.  Doubt diverticulitis. Worrisome signs and symptoms were discussed with the patient, and the patient acknowledged understanding to return to the ED if noticed. Patient was stable upon discharge.         Final Clinical Impression(s) / ED Diagnoses Final diagnoses:  Periumbilical abdominal pain    Rx / DC Orders ED Discharge Orders          Ordered    promethazine (PHENERGAN) 25 MG tablet  Every 6 hours PRN        04/27/22 1422  oxyCODONE-acetaminophen (PERCOCET/ROXICET) 5-325 MG tablet  Every 6 hours PRN        04/27/22 1422    potassium chloride SA (KLOR-CON M) 20 MEQ tablet  2 times daily        04/27/22 1422              Wilnette Kales, [place] 04/27/22 1423    Oneal Deputy K, DO 04/27/22 1641

## 2022-04-27 NOTE — ED Notes (Signed)
Pt transported to CT ?

## 2022-04-27 NOTE — ED Notes (Signed)
Pt returned from CT °

## 2022-04-27 NOTE — ED Notes (Signed)
Pt verbalizes understanding of discharge instructions. Opportunity for questions and answers were provided. Pt discharged from the ED.   ?

## 2022-05-26 ENCOUNTER — Other Ambulatory Visit: Payer: Self-pay | Admitting: Surgery

## 2022-05-26 DIAGNOSIS — R109 Unspecified abdominal pain: Secondary | ICD-10-CM

## 2022-05-26 DIAGNOSIS — N289 Disorder of kidney and ureter, unspecified: Secondary | ICD-10-CM

## 2022-06-07 ENCOUNTER — Ambulatory Visit (INDEPENDENT_AMBULATORY_CARE_PROVIDER_SITE_OTHER): Payer: Medicare Other | Admitting: Internal Medicine

## 2022-06-07 ENCOUNTER — Ambulatory Visit
Admission: RE | Admit: 2022-06-07 | Discharge: 2022-06-07 | Disposition: A | Discharge status: Home / Self Care | Payer: Medicare Other | Source: Ambulatory Visit | Attending: Surgery | Admitting: Surgery

## 2022-06-07 ENCOUNTER — Encounter: Payer: Self-pay | Admitting: Internal Medicine

## 2022-06-07 ENCOUNTER — Ambulatory Visit (HOSPITAL_COMMUNITY)
Admission: RE | Admit: 2022-06-07 | Discharge: 2022-06-07 | Disposition: A | Discharge status: Home / Self Care | Payer: Medicare Other | Source: Ambulatory Visit | Attending: Internal Medicine | Admitting: Internal Medicine

## 2022-06-07 VITALS — BP 124/82 | HR 82 | Temp 98.4°F | Resp 16 | Ht 72.0 in | Wt 216.0 lb

## 2022-06-07 DIAGNOSIS — E876 Hypokalemia: Secondary | ICD-10-CM

## 2022-06-07 DIAGNOSIS — I4510 Unspecified right bundle-branch block: Secondary | ICD-10-CM | POA: Insufficient documentation

## 2022-06-07 DIAGNOSIS — R600 Localized edema: Secondary | ICD-10-CM | POA: Insufficient documentation

## 2022-06-07 DIAGNOSIS — R109 Unspecified abdominal pain: Secondary | ICD-10-CM

## 2022-06-07 DIAGNOSIS — E785 Hyperlipidemia, unspecified: Secondary | ICD-10-CM

## 2022-06-07 DIAGNOSIS — N281 Cyst of kidney, acquired: Secondary | ICD-10-CM | POA: Diagnosis not present

## 2022-06-07 DIAGNOSIS — T502X5A Adverse effect of carbonic-anhydrase inhibitors, benzothiadiazides and other diuretics, initial encounter: Secondary | ICD-10-CM | POA: Diagnosis not present

## 2022-06-07 DIAGNOSIS — K589 Irritable bowel syndrome without diarrhea: Secondary | ICD-10-CM

## 2022-06-07 DIAGNOSIS — N289 Disorder of kidney and ureter, unspecified: Secondary | ICD-10-CM

## 2022-06-07 DIAGNOSIS — I10 Essential (primary) hypertension: Secondary | ICD-10-CM

## 2022-06-07 LAB — BASIC METABOLIC PANEL
BUN: 13 mg/dL (ref 6–23)
CO2: 31 mEq/L (ref 19–32)
Calcium: 9.3 mg/dL (ref 8.4–10.5)
Chloride: 98 mEq/L (ref 96–112)
Creatinine, Ser: 0.86 mg/dL (ref 0.40–1.50)
GFR: 104.04 mL/min (ref >60.00)
Glucose, Bld: 96 mg/dL (ref 70–99)
Potassium: 3.5 mEq/L (ref 3.5–5.1)
Sodium: 136 mEq/L (ref 135–145)

## 2022-06-07 LAB — URINALYSIS, ROUTINE W REFLEX MICROSCOPIC
Bilirubin Urine: NEGATIVE
Ketones, ur: NEGATIVE
Leukocytes,Ua: NEGATIVE
Nitrite: NEGATIVE
Specific Gravity, Urine: 1.02 (ref 1.000–1.030)
Total Protein, Urine: NEGATIVE
Urine Glucose: NEGATIVE
Urobilinogen, UA: 0.2 (ref 0.0–1.0)
WBC, UA: NONE SEEN (ref 0–?)
pH: 6.5 (ref 5.0–8.0)

## 2022-06-07 LAB — CBC WITH DIFFERENTIAL/PLATELET
Basophils Absolute: 0 10*3/uL (ref 0.0–0.1)
Basophils Relative: 0.4 % (ref 0.0–3.0)
Eosinophils Absolute: 0.3 10*3/uL (ref 0.0–0.7)
Eosinophils Relative: 2.9 % (ref 0.0–5.0)
HCT: 40.1 % (ref 39.0–52.0)
Hemoglobin: 13.7 g/dL (ref 13.0–17.0)
Lymphocytes Relative: 40.1 % (ref 12.0–46.0)
Lymphs Abs: 4.1 10*3/uL — ABNORMAL HIGH (ref 0.7–4.0)
MCHC: 34.1 g/dL (ref 30.0–36.0)
MCV: 83.6 fl (ref 78.0–100.0)
Monocytes Absolute: 0.7 10*3/uL (ref 0.1–1.0)
Monocytes Relative: 7.1 % (ref 3.0–12.0)
Neutro Abs: 5.1 10*3/uL (ref 1.4–7.7)
Neutrophils Relative %: 49.5 % (ref 43.0–77.0)
Platelets: 248 10*3/uL (ref 150.0–400.0)
RBC: 4.8 Mil/uL (ref 4.22–5.81)
RDW: 12.6 % (ref 11.5–15.5)
WBC: 10.2 10*3/uL (ref 4.0–10.5)

## 2022-06-07 LAB — BRAIN NATRIURETIC PEPTIDE: Pro B Natriuretic peptide (BNP): 17 pg/mL (ref 0.0–100.0)

## 2022-06-07 LAB — MAGNESIUM: Magnesium: 1.9 mg/dL (ref 1.5–2.5)

## 2022-06-07 LAB — TSH: TSH: 1.73 u[IU]/mL (ref 0.35–5.50)

## 2022-06-07 MED ORDER — POTASSIUM CHLORIDE CRYS ER 15 MEQ PO TBCR: 15.0000 meq | EXTENDED_RELEASE_TABLET | Freq: Two times a day (BID) | ORAL | 0 refills | Status: DC

## 2022-06-07 MED ORDER — TORSEMIDE 10 MG PO TABS: 10.0000 mg | ORAL_TABLET | Freq: Every day | ORAL | 0 refills | Status: DC

## 2022-06-07 NOTE — Progress Notes (Signed)
VASCULAR LAB    Bilateral lower extremity venous duplex has been performed.  See CV proc for preliminary results.  Attempted to call negative report to [phone] but number is disconnected  Zarriah Starkel, RVT 06/07/2022, 4:27 PM

## 2022-06-07 NOTE — Patient Instructions (Signed)
Peripheral Edema  Peripheral edema is swelling that is caused by a buildup of fluid. Peripheral edema most often affects the lower legs, ankles, and feet. It can also develop in the arms, hands, and face. The area of the body that has peripheral edema will look swollen. It may also feel heavy or warm. Your clothes may start to feel tight. Pressing on the area may make a temporary dent in your skin (pitting edema). You may not be able to move your swollen arm or leg as much as usual. There are many causes of peripheral edema. It can happen because of a complication of other conditions such as heart failure, kidney disease, or a problem with your circulation. It also can be a side effect of certain medicines or happen because of an infection. It often happens to women during pregnancy. Sometimes, the cause is not known. Follow these instructions at home: Managing pain, stiffness, and swelling  Raise (elevate) your legs while you are sitting or lying down. Move around often to prevent stiffness and to reduce swelling. Do not sit or stand for long periods of time. Do not wear tight clothing. Do not wear garters on your upper legs. Exercise your legs to get your circulation going. This helps to move the fluid back into your blood vessels, and it may help the swelling go down. Wear compression stockings as told by your health care provider. These stockings help to prevent blood clots and reduce swelling in your legs. It is important that these are the correct size. These stockings should be prescribed by your doctor to prevent possible injuries. If elastic bandages or wraps are recommended, use them as told by your health care provider. Medicines Take over-the-counter and prescription medicines only as told by your health care provider. Your health care provider may prescribe medicine to help your body get rid of excess water (diuretic). Take this medicine if you are told to take it. General  instructions Eat a low-salt (low-sodium) diet as told by your health care provider. Sometimes, eating less salt may reduce swelling. Pay attention to any changes in your symptoms. Moisturize your skin daily to help prevent skin from cracking and draining. Keep all follow-up visits. This is important. Contact a health care provider if: You have a fever. You have swelling in only one leg. You have increased swelling, redness, or pain in one or both of your legs. You have drainage or sores at the area where you have edema. Get help right away if: You have edema that starts suddenly or is getting worse, especially if you are pregnant or have a medical condition. You develop shortness of breath, especially when you are lying down. You have pain in your chest or abdomen. You feel weak. You feel like you will faint. These symptoms may be an emergency. Get help right away. Call 911. Do not wait to see if the symptoms will go away. Do not drive yourself to the hospital. Summary Peripheral edema is swelling that is caused by a buildup of fluid. Peripheral edema most often affects the lower legs, ankles, and feet. Move around often to prevent stiffness and to reduce swelling. Do not sit or stand for long periods of time. Pay attention to any changes in your symptoms. Contact a health care provider if you have edema that starts suddenly or is getting worse, especially if you are pregnant or have a medical condition. Get help right away if you develop shortness of breath, especially when lying down.   This information is not intended to replace advice given to you by your health care provider. Make sure you discuss any questions you have with your health care provider. Document Revised: 05/18/2021 Document Reviewed: 05/18/2021 Elsevier Patient Education  2023 [employer]  

## 2022-06-07 NOTE — Progress Notes (Unsigned)
Subjective:  Patient ID: Wyatt Murray, male    DOB: [birth year]  Age: 46 y.o. MRN: [id number]  CC: Hypertension   HPI Wyatt Murray presents for f/up -   Outpatient Medications Prior to Visit  Medication Sig Dispense Refill   buprenorphine (SUBUTEX) 8 MG SUBL SL tablet Place under the tongue daily.     ketorolac (TORADOL) 10 MG tablet Take 10 mg by mouth every 6 (six) hours as needed.     LORazepam (ATIVAN) 0.5 MG tablet Take 0.5 mg by mouth every 8 (eight) hours.     Metoprolol Succinate 50 MG CS24 Take by mouth.     Omega 3 1000 MG CAPS Take by mouth.     VITAMIN D PO Take by mouth.     atorvastatin (LIPITOR) 20 MG tablet Take 20 mg by mouth daily.     dicyclomine (BENTYL) 10 MG capsule Take 10 mg by mouth 4 (four) times daily -  before meals and at bedtime.     losartan-hydrochlorothiazide (HYZAAR) 50-12.5 MG tablet Take 1 tablet by mouth daily.     oxyCODONE-acetaminophen (PERCOCET/ROXICET) 5-325 MG tablet Take 1 tablet by mouth every 6 (six) hours as needed for severe pain. 9 tablet 0   potassium chloride SA (KLOR-CON M) 20 MEQ tablet Take 1 tablet (20 mEq total) by mouth 2 (two) times daily. 5 tablet 0   promethazine (PHENERGAN) 25 MG tablet Take 1 tablet (25 mg total) by mouth every 6 (six) hours as needed for nausea or vomiting. 15 tablet 0   No facility-administered medications prior to visit.    ROS Review of Systems  Objective:  BP 124/82 (BP Location: Left Arm, Patient Position: Sitting, Cuff Size: Large)   Pulse 82   Temp 98.4 F (36.9 C) (Oral)   Resp 16   Ht 6' (1.829 m)   Wt 216 lb (98 kg)   SpO2 97%   BMI 29.29 kg/m   BP Readings from Last 3 Encounters:  06/07/22 124/82  04/27/22 128/88    Wt Readings from Last 3 Encounters:  06/07/22 216 lb (98 kg)  04/27/22 190 lb (86.2 kg)    Physical Exam Cardiovascular:     Rate and Rhythm: Normal rate and regular rhythm. Occasional Extrasystoles are present.    Heart sounds: No murmur heard.     No friction rub. No gallop.     Comments: EKG- NSR with SA, 75 bpm Incomplete RBBB Prolonged QT No old to compare Pulmonary:     Effort: Pulmonary effort is normal.     Breath sounds: No stridor. No wheezing, rhonchi or rales.  Abdominal:     General: Abdomen is flat.     Palpations: There is no mass.     Tenderness: There is no abdominal tenderness. There is no guarding.     Hernia: No hernia is present.  Musculoskeletal:     Right lower leg: 1+ Pitting Edema present.     Left lower leg: 1+ Pitting Edema present.  Neurological:     General: No focal deficit present.     Lab Results  Component Value Date   WBC 10.2 06/07/2022   HGB 13.7 06/07/2022   HCT 40.1 06/07/2022   PLT 248.0 06/07/2022   GLUCOSE 96 06/07/2022   ALT 31 04/27/2022   AST 36 04/27/2022   NA 136 06/07/2022   K 3.5 06/07/2022   CL 98 06/07/2022   CREATININE 0.86 06/07/2022   BUN 13 06/07/2022   CO2 31  06/07/2022   TSH 1.73 06/07/2022    CT Abdomen Pelvis Wo Contrast  Result Date: 04/27/2022 CLINICAL DATA:  Abdominal pain worse on the left side with tenderness to palpation. Nausea, vomiting. EXAM: CT ABDOMEN AND PELVIS WITHOUT CONTRAST TECHNIQUE: Multidetector CT imaging of the abdomen and pelvis was performed following the standard protocol without IV contrast. RADIATION DOSE REDUCTION: This exam was performed according to the departmental dose-optimization program which includes automated exposure control, adjustment of the mA and/or kV according to patient size and/or use of iterative reconstruction technique. COMPARISON:  CT abdomen/pelvis 10/14/2013 FINDINGS: Lower chest: The lung bases are clear. The imaged heart is unremarkable. Hepatobiliary: The liver and gallbladder are unremarkable. There is no biliary ductal dilatation. Pancreas: Unremarkable. Spleen: Unremarkable. Adrenals/Urinary Tract: The adrenals are unremarkable. There is a 1.1 cm hypodense partially exophytic lesion arising from the right  kidney upper pole which was not present in 2015. No other definite focal lesions are seen within the confines of noncontrast technique. There are no stones in either kidney or along the course of either ureter. There is no hydronephrosis or hydroureter. There is perinephric stranding bilaterally, nonspecific. The bladder is unremarkable. Stomach/Bowel: Stomach is unremarkable. There is no evidence of bowel obstruction. There is no abnormal bowel wall thickening or inflammatory changes postsurgical changes are noted in the small bowel in the mid abdomen, stable. The appendix is not identified, but there is no pericecal inflammatory change. Vascular/Lymphatic: The abdominal aorta is normal in course and caliber. There is no abdominopelvic lymphadenopathy. Reproductive: The prostate and seminal vesicles are unremarkable. Left larger than right scrotal hydroceles are seen. Other: There is no ascites or free air. Postsurgical changes along the anterior abdominal wall likely reflect prior hernia repair. Musculoskeletal: There is no acute osseous abnormality or suspicious osseous lesion. IMPRESSION: 1. No acute finding in the abdomen or pelvis. 2. Partially exophytic lesion arising from the right kidney most likely reflects a benign cyst but is technically indeterminate on this study. Recommend nonemergent renal ultrasound for initial further evaluation. 3. Left larger than right scrotal hydroceles. Electronically Signed   By: Valetta Mole M.D.   On: 04/27/2022 13:59   VAS [place] LOWER EXTREMITY VENOUS (DVT)  Result Date: 06/07/2022  Lower Venous DVT Study Patient Name:  Wyatt Murray  Date of Exam:   06/07/2022 Medical Rec #: [id number]          Accession #:    [id number] Date of Birth: March 19, 1976          Patient Gender: M Patient Age:   89 years Exam Location:  [facility] Procedure:      VAS [place] LOWER EXTREMITY VENOUS (DVT) Referring Phys: Scarlette Calico  --------------------------------------------------------------------------------  Indications: Swelling.  Comparison Study: No prior study on file Performing Technologist: Sharion Dove RVS  Examination Guidelines: A complete evaluation includes B-mode imaging, spectral Doppler, color Doppler, and power Doppler as needed of all accessible portions of each vessel. Bilateral testing is considered an integral part of a complete examination. Limited examinations for reoccurring indications may be performed as noted. The reflux portion of the exam is performed with the patient in reverse Trendelenburg.  +---------+---------------+---------+-----------+----------+--------------+ RIGHT    CompressibilityPhasicitySpontaneityPropertiesThrombus Aging +---------+---------------+---------+-----------+----------+--------------+ CFV      Full           Yes      Yes                                 +---------+---------------+---------+-----------+----------+--------------+  SFJ      Full                                                        +---------+---------------+---------+-----------+----------+--------------+ FV Prox  Full                                                        +---------+---------------+---------+-----------+----------+--------------+ FV Mid   Full                                                        +---------+---------------+---------+-----------+----------+--------------+ FV DistalFull                                                        +---------+---------------+---------+-----------+----------+--------------+ PFV      Full                                                        +---------+---------------+---------+-----------+----------+--------------+ POP      Full           Yes      Yes                                 +---------+---------------+---------+-----------+----------+--------------+ PTV      Full                                                         +---------+---------------+---------+-----------+----------+--------------+ PERO     Full                                                        +---------+---------------+---------+-----------+----------+--------------+   +---------+---------------+---------+-----------+----------+--------------+ LEFT     CompressibilityPhasicitySpontaneityPropertiesThrombus Aging +---------+---------------+---------+-----------+----------+--------------+ CFV      Full           Yes      Yes                                 +---------+---------------+---------+-----------+----------+--------------+ SFJ      Full                                                        +---------+---------------+---------+-----------+----------+--------------+  FV Prox  Full                                                        +---------+---------------+---------+-----------+----------+--------------+ FV Mid   Full                                                        +---------+---------------+---------+-----------+----------+--------------+ FV DistalFull                                                        +---------+---------------+---------+-----------+----------+--------------+ PFV      Full                                                        +---------+---------------+---------+-----------+----------+--------------+ POP      Full           Yes      Yes                                 +---------+---------------+---------+-----------+----------+--------------+ PTV      Full                                                        +---------+---------------+---------+-----------+----------+--------------+ PERO     Full                                                        +---------+---------------+---------+-----------+----------+--------------+     Summary: BILATERAL: - No evidence of deep vein thrombosis seen in the lower extremities,  bilaterally. -No evidence of popliteal cyst, bilaterally.   *See table(s) above for measurements and observations. Electronically signed by Monica Martinez MD on 06/07/2022 at 7:08:53 PM.    Final    US RENAL  Result Date: 06/07/2022 CLINICAL DATA:  Follow-up right kidney lesions seen on CT. EXAM: RENAL / URINARY TRACT ULTRASOUND COMPLETE COMPARISON:  CT abdomen pelvis 04/27/2022 FINDINGS: Right Kidney: Renal measurements: 10.6 x 5.9 x 5.5 cm = volume: 181 mL. Echogenicity within normal limits. No definite mass visualized though visualization is somewhat limited by shadowing bowel gas. No hydronephrosis. Left Kidney: Renal measurements: 11.7 x 6.4 x 6.2 cm = volume: 242 mL. Echogenicity within normal limits. No mass or hydronephrosis visualized. Bladder: Appears normal for degree of bladder distention. Other: None. IMPRESSION: 1. No definite mass identified within the right liver though visualization is limited by shadowing bowel gas. Consider renal protocol MRI or CT for further evaluation.  2.  Normal appearance of the left kidney. Electronically Signed   By: Audie Pinto M.D.   On: 06/07/2022 14:09     Assessment & Plan:   [place] was seen today for hypertension.  Diagnoses and all orders for this visit:  Renal cyst, right -     Urinalysis, Routine w reflex microscopic; Future -     Urinalysis, Routine w reflex microscopic  Bilateral leg edema -     EKG 12-Lead -     Brain natriuretic peptide; Future -     TSH; Future -     Urinalysis, Routine w reflex microscopic; Future -     Urinalysis, Routine w reflex microscopic -     TSH -     Brain natriuretic peptide -     VAS [place] LOWER EXTREMITY VENOUS (DVT); Future -     torsemide (DEMADEX) 10 MG tablet; Take 1 tablet (10 mg total) by mouth daily. -     Ambulatory referral to Vascular Surgery  Primary hypertension -     EKG 12-Lead -     Basic metabolic panel; Future -     Magnesium; Future -     TSH; Future -     Urinalysis, Routine  w reflex microscopic; Future -     CBC with Differential/Platelet; Future -     CBC with Differential/Platelet -     Urinalysis, Routine w reflex microscopic -     TSH -     Magnesium -     Basic metabolic panel -     torsemide (DEMADEX) 10 MG tablet; Take 1 tablet (10 mg total) by mouth daily. -     potassium chloride SA (KLOR-CON M15) 15 MEQ tablet; Take 1 tablet (15 mEq total) by mouth 2 (two) times daily.  Diuretic-induced hypokalemia -     Basic metabolic panel; Future -     Magnesium; Future -     Magnesium -     Basic metabolic panel -     potassium chloride SA (KLOR-CON M15) 15 MEQ tablet; Take 1 tablet (15 mEq total) by mouth 2 (two) times daily.  Incomplete right bundle branch block (RBBB) -     Ambulatory referral to Cardiology  Hyperlipidemia LDL goal <100 -     atorvastatin (LIPITOR) 20 MG tablet; Take 1 tablet (20 mg total) by mouth daily.  Irritable bowel syndrome, unspecified type -     dicyclomine (BENTYL) 10 MG capsule; Take 1 capsule (10 mg total) by mouth 3 (three) times daily before meals.   I have discontinued Alem Fahl. Kellis's promethazine, oxyCODONE-acetaminophen, potassium chloride SA, and losartan-hydrochlorothiazide. I have also changed his atorvastatin and dicyclomine. Additionally, I am having him start on torsemide and potassium chloride SA. Lastly, I am having him maintain his buprenorphine, LORazepam, Metoprolol Succinate, ketorolac, VITAMIN D PO, and Omega 3.  Meds ordered this encounter  Medications   torsemide (DEMADEX) 10 MG tablet    Sig: Take 1 tablet (10 mg total) by mouth daily.    Dispense:  90 tablet    Refill:  0   potassium chloride SA (KLOR-CON M15) 15 MEQ tablet    Sig: Take 1 tablet (15 mEq total) by mouth 2 (two) times daily.    Dispense:  180 tablet    Refill:  0   atorvastatin (LIPITOR) 20 MG tablet    Sig: Take 1 tablet (20 mg total) by mouth daily.    Dispense:  90 tablet    Refill:  1   dicyclomine (BENTYL) 10 MG  capsule    Sig: Take 1 capsule (10 mg total) by mouth 3 (three) times daily before meals.    Dispense:  120 capsule    Refill:  3     Follow-up: Return in about 3 months (around 09/06/2022).  Scarlette Calico, MD

## 2022-06-08 ENCOUNTER — Encounter: Payer: Self-pay | Admitting: Internal Medicine

## 2022-06-08 DIAGNOSIS — K589 Irritable bowel syndrome without diarrhea: Secondary | ICD-10-CM | POA: Insufficient documentation

## 2022-06-08 DIAGNOSIS — E785 Hyperlipidemia, unspecified: Secondary | ICD-10-CM | POA: Insufficient documentation

## 2022-06-08 MED ORDER — DICYCLOMINE HCL 10 MG PO CAPS: 10.0000 mg | ORAL_CAPSULE | Freq: Three times a day (TID) | ORAL | 3 refills | Status: DC

## 2022-06-08 MED ORDER — ATORVASTATIN CALCIUM 20 MG PO TABS: 20.0000 mg | ORAL_TABLET | Freq: Every day | ORAL | 1 refills | Status: DC

## 2022-06-09 ENCOUNTER — Encounter: Payer: Self-pay | Admitting: Internal Medicine

## 2022-06-09 NOTE — Addendum Note (Signed)
Addended by: Janith Lima on: 06/09/2022 05:30 PM   Modules accepted: Level of Service

## 2022-06-21 ENCOUNTER — Encounter: Payer: Self-pay | Admitting: Internal Medicine

## 2022-06-26 NOTE — Progress Notes (Unsigned)
Cardiology Office Note   Date:  06/26/2022   ID:  Wyatt Murray, DOB 1976/09/10, MRN [id number]  PCP:  Janith Lima, MD  Cardiologist:   None Referring:  ***  No chief complaint on file.     History of Present Illness: Wyatt Murray is a 46 y.o. male who presents for evaluation of RBBB.  ***   Past Medical History:  Diagnosis Date   Blood in stool    Depression    GERD (gastroesophageal reflux disease)    Hyperlipidemia    Hypertension     Past Surgical History:  Procedure Laterality Date   APPENDECTOMY     BOWEL RESECTION     HERNIA REPAIR       Current Outpatient Medications  Medication Sig Dispense Refill   atorvastatin (LIPITOR) 20 MG tablet Take 1 tablet (20 mg total) by mouth daily. 90 tablet 1   buprenorphine (SUBUTEX) 8 MG SUBL SL tablet Place under the tongue daily.     dicyclomine (BENTYL) 10 MG capsule Take 1 capsule (10 mg total) by mouth 3 (three) times daily before meals. 120 capsule 3   ketorolac (TORADOL) 10 MG tablet Take 10 mg by mouth every 6 (six) hours as needed.     LORazepam (ATIVAN) 0.5 MG tablet Take 0.5 mg by mouth every 8 (eight) hours.     Metoprolol Succinate 50 MG CS24 Take by mouth.     Omega 3 1000 MG CAPS Take by mouth.     potassium chloride SA (KLOR-CON M15) 15 MEQ tablet Take 1 tablet (15 mEq total) by mouth 2 (two) times daily. 180 tablet 0   torsemide (DEMADEX) 10 MG tablet Take 1 tablet (10 mg total) by mouth daily. 90 tablet 0   VITAMIN D PO Take by mouth.     No current facility-administered medications for this visit.    Allergies:   Iodinated contrast media, Iodine, Midazolam, Shellfish allergy, and Atorvastatin    Social History:  The patient  reports that he has been smoking cigarettes. He has been exposed to tobacco smoke. He has never used smokeless tobacco. He reports that he does not currently use alcohol. He reports that he does not currently use drugs.   Family History:  The patient's ***family  history includes Alcohol abuse in his father and paternal grandfather; Arthritis in his father and mother; Cancer in his father and paternal grandfather; Diabetes in his mother; Heart disease in his mother; Hyperlipidemia in his mother and paternal grandfather; Hypertension in his mother and paternal grandfather; Stroke in his mother.    ROS:  Please see the history of present illness.   Otherwise, review of systems are positive for {NONE DEFAULTED:18576}.   All other systems are reviewed and negative.    PHYSICAL EXAM: VS:  There were no vitals taken for this visit. , BMI There is no height or weight on file to calculate BMI. GENERAL:  Well appearing HEENT:  Pupils equal round and reactive, fundi not visualized, oral mucosa unremarkable NECK:  No jugular venous distention, waveform within normal limits, carotid upstroke brisk and symmetric, no bruits, no thyromegaly LYMPHATICS:  No cervical, inguinal adenopathy LUNGS:  Clear to auscultation bilaterally BACK:  No CVA tenderness CHEST:  Unremarkable HEART:  PMI not displaced or sustained,S1 and S2 within normal limits, no S3, no S4, no clicks, no rubs, *** murmurs ABD:  Flat, positive bowel sounds normal in frequency in pitch, no bruits, no rebound, no guarding, no midline  pulsatile mass, no hepatomegaly, no splenomegaly EXT:  2 plus pulses throughout, no edema, no cyanosis no clubbing SKIN:  No rashes no nodules NEURO:  Cranial nerves II through XII grossly intact, motor grossly intact throughout PSYCH:  Cognitively intact, oriented to person place and time    EKG:  EKG {ACTION; IS/IS ASN:[id number]} ordered today. The ekg ordered today demonstrates ***   Recent Labs: 04/27/2022: ALT 31 06/07/2022: BUN 13; Creatinine, Ser 0.86; Hemoglobin 13.7; Magnesium 1.9; Platelets 248.0; Potassium 3.5; Pro B Natriuretic peptide (BNP) 17.0; Sodium 136; TSH 1.73    Lipid Panel No results found for: "CHOL", "TRIG", "HDL", "CHOLHDL", "VLDL", "LDLCALC",  "LDLDIRECT"    Wt Readings from Last 3 Encounters:  06/07/22 216 lb (98 kg)  04/27/22 190 lb (86.2 kg)      Other studies Reviewed: Additional studies/ records that were reviewed today include: ***. Review of the above records demonstrates:  Please see elsewhere in the note.  ***   ASSESSMENT AND PLAN:  RBBB:  ***   Current medicines are reviewed at length with the patient today.  The patient {ACTIONS; HAS/DOES NOT HAVE:19233} concerns regarding medicines.  The following changes have been made:  {PLAN; NO CHANGE:13088:s}  Labs/ tests ordered today include: *** No orders of the defined types were placed in this encounter.    Disposition:   FU with ***    Signed, Minus Breeding, MD  06/26/2022 1:37 PM    [place]

## 2022-06-28 ENCOUNTER — Encounter: Payer: Self-pay | Admitting: Cardiology

## 2022-06-28 ENCOUNTER — Ambulatory Visit: Payer: Medicare Other | Attending: Cardiology | Admitting: Cardiology

## 2022-06-28 VITALS — BP 108/70 | HR 84 | Ht 72.0 in | Wt 209.8 lb

## 2022-06-28 DIAGNOSIS — I4510 Unspecified right bundle-branch block: Secondary | ICD-10-CM | POA: Insufficient documentation

## 2022-06-28 NOTE — Patient Instructions (Signed)
  Follow-Up: At [facility], you and your health needs are our priority.  As part of our continuing mission to provide you with exceptional heart care, we have created designated Provider Care Teams.  These Care Teams include your primary Cardiologist (physician) and Advanced Practice Providers (APPs -  Physician Assistants and Nurse Practitioners) who all work together to provide you with the care you need, when you need it.  We recommend signing up for the patient portal called "MyChart".  Sign up information is provided on this After Visit Summary.  MyChart is used to connect with patients for Virtual Visits (Telemedicine).  Patients are able to view lab/test results, encounter notes, upcoming appointments, etc.  Non-urgent messages can be sent to your provider as well.   To learn more about what you can do with MyChart, go to [email or web address].    Your next appointment:    AS NEEDED 

## 2022-07-08 ENCOUNTER — Ambulatory Visit: Payer: Self-pay | Admitting: Internal Medicine

## 2022-08-11 ENCOUNTER — Other Ambulatory Visit: Payer: Self-pay | Admitting: Urology

## 2022-08-11 DIAGNOSIS — N433 Hydrocele, unspecified: Secondary | ICD-10-CM

## 2022-08-11 DIAGNOSIS — N281 Cyst of kidney, acquired: Secondary | ICD-10-CM

## 2022-08-24 ENCOUNTER — Ambulatory Visit
Admission: RE | Admit: 2022-08-24 | Discharge: 2022-08-24 | Disposition: A | Discharge status: Home / Self Care | Payer: Medicare Other | Source: Ambulatory Visit | Attending: Urology | Admitting: Urology

## 2022-08-24 DIAGNOSIS — N433 Hydrocele, unspecified: Secondary | ICD-10-CM

## 2022-08-27 ENCOUNTER — Other Ambulatory Visit: Payer: Self-pay | Admitting: Internal Medicine

## 2022-08-27 ENCOUNTER — Encounter: Payer: Self-pay | Admitting: Internal Medicine

## 2022-08-27 ENCOUNTER — Other Ambulatory Visit: Payer: Self-pay

## 2022-08-27 DIAGNOSIS — I10 Essential (primary) hypertension: Secondary | ICD-10-CM

## 2022-08-27 MED ORDER — METOPROLOL SUCCINATE 50 MG PO CS24: 1.0000 | EXTENDED_RELEASE_CAPSULE | Freq: Every day | ORAL | 1 refills | Status: DC

## 2022-08-28 ENCOUNTER — Ambulatory Visit
Admission: RE | Admit: 2022-08-28 | Discharge: 2022-08-28 | Disposition: A | Discharge status: Home / Self Care | Payer: Medicare Other | Source: Ambulatory Visit | Attending: Urology | Admitting: Urology

## 2022-08-28 DIAGNOSIS — N281 Cyst of kidney, acquired: Secondary | ICD-10-CM

## 2022-09-24 ENCOUNTER — Encounter: Payer: Self-pay | Admitting: Internal Medicine

## 2022-09-24 ENCOUNTER — Other Ambulatory Visit: Payer: Self-pay | Admitting: Internal Medicine

## 2022-09-24 DIAGNOSIS — R600 Localized edema: Secondary | ICD-10-CM

## 2022-09-24 DIAGNOSIS — I10 Essential (primary) hypertension: Secondary | ICD-10-CM

## 2022-10-04 ENCOUNTER — Encounter: Payer: Self-pay | Admitting: Internal Medicine

## 2022-10-04 ENCOUNTER — Ambulatory Visit (INDEPENDENT_AMBULATORY_CARE_PROVIDER_SITE_OTHER): Payer: Medicare Other | Admitting: Internal Medicine

## 2022-10-04 VITALS — BP 128/86 | HR 80 | Temp 97.8°F | Resp 16 | Ht 72.0 in | Wt 224.0 lb

## 2022-10-04 DIAGNOSIS — I10 Essential (primary) hypertension: Secondary | ICD-10-CM | POA: Diagnosis not present

## 2022-10-04 DIAGNOSIS — T502X5A Adverse effect of carbonic-anhydrase inhibitors, benzothiadiazides and other diuretics, initial encounter: Secondary | ICD-10-CM

## 2022-10-04 DIAGNOSIS — E876 Hypokalemia: Secondary | ICD-10-CM | POA: Diagnosis not present

## 2022-10-04 DIAGNOSIS — E785 Hyperlipidemia, unspecified: Secondary | ICD-10-CM

## 2022-10-04 DIAGNOSIS — Z23 Encounter for immunization: Secondary | ICD-10-CM

## 2022-10-04 LAB — BASIC METABOLIC PANEL
BUN: 11 mg/dL (ref 6–23)
CO2: 31 mEq/L (ref 19–32)
Calcium: 9.3 mg/dL (ref 8.4–10.5)
Chloride: 101 mEq/L (ref 96–112)
Creatinine, Ser: 0.86 mg/dL (ref 0.40–1.50)
GFR: 103.8 mL/min (ref >60.00)
Glucose, Bld: 113 mg/dL — ABNORMAL HIGH (ref 70–99)
Potassium: 4.1 mEq/L (ref 3.5–5.1)
Sodium: 139 mEq/L (ref 135–145)

## 2022-10-04 LAB — MAGNESIUM: Magnesium: 1.9 mg/dL (ref 1.5–2.5)

## 2022-10-04 MED ORDER — BOOSTRIX 5-2.5-18.5 LF-MCG/0.5 IM SUSP: 0.5000 mL | Freq: Once | INTRAMUSCULAR | 0 refills | Status: AC

## 2022-10-04 NOTE — Patient Instructions (Signed)
Hypertension, Adult High blood pressure (hypertension) is when the force of blood pumping through the arteries is too strong. The arteries are the blood vessels that carry blood from the heart throughout the body. Hypertension forces the heart to work harder to pump blood and may cause arteries to become narrow or stiff. Untreated or uncontrolled hypertension can lead to a heart attack, heart failure, a stroke, kidney disease, and other problems. A blood pressure reading consists of a higher number over a lower number. Ideally, your blood pressure should be below 120/80. The first ("top") number is called the systolic pressure. It is a measure of the pressure in your arteries as your heart beats. The second ("bottom") number is called the diastolic pressure. It is a measure of the pressure in your arteries as the heart relaxes. What are the causes? The exact cause of this condition is not known. There are some conditions that result in high blood pressure. What increases the risk? Certain factors may make you more likely to develop high blood pressure. Some of these risk factors are under your control, including: Smoking. Not getting enough exercise or physical activity. Being overweight. Having too much fat, sugar, calories, or salt (sodium) in your diet. Drinking too much alcohol. Other risk factors include: Having a personal history of heart disease, diabetes, high cholesterol, or kidney disease. Stress. Having a family history of high blood pressure and high cholesterol. Having obstructive sleep apnea. Age. The risk increases with age. What are the signs or symptoms? High blood pressure may not cause symptoms. Very high blood pressure (hypertensive crisis) may cause: Headache. Fast or irregular heartbeats (palpitations). Shortness of breath. Nosebleed. Nausea and vomiting. Vision changes. Severe chest pain, dizziness, and seizures. How is this diagnosed? This condition is diagnosed by  measuring your blood pressure while you are seated, with your arm resting on a flat surface, your legs uncrossed, and your feet flat on the floor. The cuff of the blood pressure monitor will be placed directly against the skin of your upper arm at the level of your heart. Blood pressure should be measured at least twice using the same arm. Certain conditions can cause a difference in blood pressure between your right and left arms. If you have a high blood pressure reading during one visit or you have normal blood pressure with other risk factors, you may be asked to: Return on a different day to have your blood pressure checked again. Monitor your blood pressure at home for 1 week or longer. If you are diagnosed with hypertension, you may have other blood or imaging tests to help your health care provider understand your overall risk for other conditions. How is this treated? This condition is treated by making healthy lifestyle changes, such as eating healthy foods, exercising more, and reducing your alcohol intake. You may be referred for counseling on a healthy diet and physical activity. Your health care provider may prescribe medicine if lifestyle changes are not enough to get your blood pressure under control and if: Your systolic blood pressure is above 130. Your diastolic blood pressure is above 80. Your personal target blood pressure may vary depending on your medical conditions, your age, and other factors. Follow these instructions at home: Eating and drinking  Eat a diet that is high in fiber and potassium, and low in sodium, added sugar, and fat. An example of this eating plan is called the DASH diet. DASH stands for Dietary Approaches to Stop Hypertension. To eat this way: Eat   plenty of fresh fruits and vegetables. Try to fill one half of your plate at each meal with fruits and vegetables. Eat whole grains, such as whole-wheat pasta, brown rice, or whole-grain bread. Fill about one  fourth of your plate with whole grains. Eat or drink low-fat dairy products, such as skim milk or low-fat yogurt. Avoid fatty cuts of meat, processed or cured meats, and poultry with skin. Fill about one fourth of your plate with lean proteins, such as fish, chicken without skin, beans, eggs, or tofu. Avoid pre-made and processed foods. These tend to be higher in sodium, added sugar, and fat. Reduce your daily sodium intake. Many people with hypertension should eat less than 1,500 mg of sodium a day. Do not drink alcohol if: Your health care provider tells you not to drink. You are pregnant, may be pregnant, or are planning to become pregnant. If you drink alcohol: Limit how much you have to: 0-1 drink a day for women. 0-2 drinks a day for men. Know how much alcohol is in your drink. In the [place] one drink equals one 12 oz bottle of beer (355 mL), one 5 oz glass of wine (148 mL), or one 1 oz glass of hard liquor (44 mL). Lifestyle  Work with your health care provider to maintain a healthy body weight or to lose weight. Ask what an ideal weight is for you. Get at least 30 minutes of exercise that causes your heart to beat faster (aerobic exercise) most days of the week. Activities may include walking, swimming, or biking. Include exercise to strengthen your muscles (resistance exercise), such as Pilates or lifting weights, as part of your weekly exercise routine. Try to do these types of exercises for 30 minutes at least 3 days a week. Do not use any products that contain nicotine or tobacco. These products include cigarettes, chewing tobacco, and vaping devices, such as e-cigarettes. If you need help quitting, ask your health care provider. Monitor your blood pressure at home as told by your health care provider. Keep all follow-up visits. This is important. Medicines Take over-the-counter and prescription medicines only as told by your health care provider. Follow directions carefully. Blood  pressure medicines must be taken as prescribed. Do not skip doses of blood pressure medicine. Doing this puts you at risk for problems and can make the medicine less effective. Ask your health care provider about side effects or reactions to medicines that you should watch for. Contact a health care provider if you: Think you are having a reaction to a medicine you are taking. Have headaches that keep coming back (recurring). Feel dizzy. Have swelling in your ankles. Have trouble with your vision. Get help right away if you: Develop a severe headache or confusion. Have unusual weakness or numbness. Feel faint. Have severe pain in your chest or abdomen. Vomit repeatedly. Have trouble breathing. These symptoms may be an emergency. Get help right away. Call 911. Do not wait to see if the symptoms will go away. Do not drive yourself to the hospital. Summary Hypertension is when the force of blood pumping through your arteries is too strong. If this condition is not controlled, it may put you at risk for serious complications. Your personal target blood pressure may vary depending on your medical conditions, your age, and other factors. For most people, a normal blood pressure is less than 120/80. Hypertension is treated with lifestyle changes, medicines, or a combination of both. Lifestyle changes include losing weight, eating a healthy,   low-sodium diet, exercising more, and limiting alcohol. This information is not intended to replace advice given to you by your health care provider. Make sure you discuss any questions you have with your health care provider. Document Revised: 07/21/2021 Document Reviewed: 07/21/2021 Elsevier Patient Education  2023 [employer]  

## 2022-10-04 NOTE — Progress Notes (Signed)
Subjective:  Patient ID: Wyatt Murray, male    DOB: [birth year]  Age: 47 y.o. MRN: [id number]  CC: Hypertension   HPI Wyatt Murray presents for f/up -  He is active and denies chest pain, shortness of breath, diaphoresis, or edema.  Outpatient Medications Prior to Visit  Medication Sig Dispense Refill   atorvastatin (LIPITOR) 20 MG tablet Take 1 tablet (20 mg total) by mouth daily. 90 tablet 1   buprenorphine (SUBUTEX) 8 MG SUBL SL tablet Place under the tongue daily.     dicyclomine (BENTYL) 10 MG capsule Take 1 capsule (10 mg total) by mouth 3 (three) times daily before meals. 120 capsule 3   ketorolac (TORADOL) 10 MG tablet Take 10 mg by mouth every 6 (six) hours as needed.     LORazepam (ATIVAN) 0.5 MG tablet Take 0.5 mg by mouth every 8 (eight) hours.     Metoprolol Succinate 50 MG CS24 Take 1 tablet by mouth daily. 90 capsule 1   Omega 3 1000 MG CAPS Take by mouth.     VITAMIN D PO Take by mouth.     potassium chloride SA (KLOR-CON M15) 15 MEQ tablet Take 1 tablet (15 mEq total) by mouth 2 (two) times daily. 180 tablet 0   torsemide (DEMADEX) 10 MG tablet Take 1 tablet (10 mg total) by mouth daily. 90 tablet 0   No facility-administered medications prior to visit.    ROS Review of Systems  Constitutional: Negative.  Negative for diaphoresis and fatigue.  HENT: Negative.    Eyes: Negative.   Respiratory:  Negative for cough, chest tightness, shortness of breath and wheezing.   Cardiovascular:  Negative for chest pain, palpitations and leg swelling.  Gastrointestinal:  Negative for abdominal pain, diarrhea and nausea.  Endocrine: Negative.   Genitourinary: Negative.  Negative for difficulty urinating.  Musculoskeletal: Negative.   Skin: Negative.   Neurological:  Negative for dizziness, weakness and light-headedness.  Hematological: Negative.   Psychiatric/Behavioral: Negative.      Objective:  BP 128/86 (BP Location: Left Arm, Patient Position: Sitting,  Cuff Size: Large)   Pulse 80   Temp 97.8 F (36.6 C) (Oral)   Resp 16   Ht 6' (1.829 m)   Wt 224 lb (101.6 kg)   SpO2 95%   BMI 30.38 kg/m   BP Readings from Last 3 Encounters:  10/04/22 128/86  06/28/22 108/70  06/07/22 124/82    Wt Readings from Last 3 Encounters:  10/04/22 224 lb (101.6 kg)  06/28/22 209 lb 12.8 oz (95.2 kg)  06/07/22 216 lb (98 kg)    Physical Exam Vitals reviewed.  HENT:     Nose: Nose normal.     Mouth/Throat:     Mouth: Mucous membranes are moist.  Eyes:     General: No scleral icterus.    Conjunctiva/sclera: Conjunctivae normal.  Cardiovascular:     Rate and Rhythm: Normal rate and regular rhythm.     Heart sounds: No murmur heard. Pulmonary:     Effort: Pulmonary effort is normal.     Breath sounds: No stridor. No wheezing, rhonchi or rales.  Abdominal:     General: Abdomen is flat.     Palpations: There is no mass.     Tenderness: There is no abdominal tenderness. There is no guarding.     Hernia: No hernia is present.  Musculoskeletal:        General: Normal range of motion.     Cervical back: Neck  supple.     Right lower leg: No edema.     Left lower leg: No edema.  Lymphadenopathy:     Cervical: No cervical adenopathy.  Skin:    General: Skin is warm and dry.  Neurological:     General: No focal deficit present.     Mental Status: He is alert.  Psychiatric:        Mood and Affect: Mood normal.        Behavior: Behavior normal.     Lab Results  Component Value Date   WBC 10.2 06/07/2022   HGB 13.7 06/07/2022   HCT 40.1 06/07/2022   PLT 248.0 06/07/2022   GLUCOSE 113 (H) 10/04/2022   CHOL 170 07/17/2020   TRIG 103 07/17/2020   HDL 38 07/17/2020   LDLCALC 111 07/17/2020   ALT 31 04/27/2022   AST 36 04/27/2022   NA 139 10/04/2022   K 4.1 10/04/2022   CL 101 10/04/2022   CREATININE 0.86 10/04/2022   BUN 11 10/04/2022   CO2 31 10/04/2022   TSH 1.73 06/07/2022    No results found.  Assessment & Plan:    Wyatt Murray was seen today for hypertension.  Diagnoses and all orders for this visit:  Diuretic-induced hypokalemia- K+ is normal now. -     Basic metabolic panel; Future -     Magnesium; Future -     Magnesium -     Basic metabolic panel -     potassium chloride SA (KLOR-CON M15) 15 MEQ tablet; Take 1 tablet (15 mEq total) by mouth 2 (two) times daily.  Primary hypertension- BP is well controlled. Lytes and renal function are normal. -     Basic metabolic panel; Future -     Magnesium; Future -     Magnesium -     Basic metabolic panel -     torsemide (DEMADEX) 10 MG tablet; Take 1 tablet (10 mg total) by mouth daily. -     potassium chloride SA (KLOR-CON M15) 15 MEQ tablet; Take 1 tablet (15 mEq total) by mouth 2 (two) times daily.  Need for prophylactic vaccination with combined diphtheria-tetanus-pertussis (DTP) vaccine -     Tdap (BOOSTRIX) 5-2.5-18.5 LF-MCG/0.5 injection; Inject 0.5 mLs into the muscle once for 1 dose.   I am having Wyatt Murray. Troeger start on Boostrix. I am also having him maintain his buprenorphine, LORazepam, ketorolac, VITAMIN D PO, Omega 3, atorvastatin, dicyclomine, Metoprolol Succinate, torsemide, and potassium chloride SA.  Meds ordered this encounter  Medications   Tdap (BOOSTRIX) 5-2.5-18.5 LF-MCG/0.5 injection    Sig: Inject 0.5 mLs into the muscle once for 1 dose.    Dispense:  0.5 mL    Refill:  0   torsemide (DEMADEX) 10 MG tablet    Sig: Take 1 tablet (10 mg total) by mouth daily.    Dispense:  90 tablet    Refill:  1   potassium chloride SA (KLOR-CON M15) 15 MEQ tablet    Sig: Take 1 tablet (15 mEq total) by mouth 2 (two) times daily.    Dispense:  180 tablet    Refill:  1     Follow-up: Return in about 6 months (around 04/04/2023).  Sanda Linger, MD

## 2022-10-05 ENCOUNTER — Other Ambulatory Visit: Payer: Self-pay | Admitting: Internal Medicine

## 2022-10-05 ENCOUNTER — Encounter: Payer: Self-pay | Admitting: Internal Medicine

## 2022-10-05 DIAGNOSIS — I10 Essential (primary) hypertension: Secondary | ICD-10-CM

## 2022-10-05 DIAGNOSIS — R600 Localized edema: Secondary | ICD-10-CM

## 2022-10-05 DIAGNOSIS — Z23 Encounter for immunization: Secondary | ICD-10-CM | POA: Insufficient documentation

## 2022-10-05 DIAGNOSIS — E876 Hypokalemia: Secondary | ICD-10-CM

## 2022-10-05 MED ORDER — TORSEMIDE 10 MG PO TABS: 10.0000 mg | ORAL_TABLET | Freq: Every day | ORAL | 1 refills | Status: AC

## 2022-10-05 MED ORDER — POTASSIUM CHLORIDE CRYS ER 15 MEQ PO TBCR: 15.0000 meq | EXTENDED_RELEASE_TABLET | Freq: Two times a day (BID) | ORAL | 1 refills | Status: AC

## 2022-11-01 ENCOUNTER — Ambulatory Visit: Payer: Medicare Other | Admitting: Internal Medicine

## 2022-11-04 ENCOUNTER — Other Ambulatory Visit: Payer: Self-pay | Admitting: Urology

## 2022-11-04 DIAGNOSIS — D49511 Neoplasm of unspecified behavior of right kidney: Secondary | ICD-10-CM

## 2022-11-08 ENCOUNTER — Ambulatory Visit
Admission: RE | Admit: 2022-11-08 | Discharge: 2022-11-08 | Disposition: A | Discharge status: Home / Self Care | Payer: Medicare Other | Source: Ambulatory Visit | Attending: Urology | Admitting: Urology

## 2022-11-08 DIAGNOSIS — D49511 Neoplasm of unspecified behavior of right kidney: Secondary | ICD-10-CM

## 2022-11-08 NOTE — Consult Note (Signed)
Chief Complaint: Patient was seen in consultation today for renal lesion at the request of Gay,Matthew R  Referring Physician(s): Gay,Matthew R  History of Present Illness: Wyatt Murray is a 47 y.o. male Had a CT 04/27/2022 for left-sided abdominal pain secondary to hydrocele.  A new partially exophytic lesion from the upper pole right kidney was identified, new since prior CT of 10/14/2013.  The lesion could not be identified on subsequent ultrasound.  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  No evidence of renal vein involvement or regional metastatic disease.  His kidney function remains normal.  He does have contrast allergy which complicated getting the contrast-enhanced CT scans.  He is claustrophobic, relatively adverse to concept of   MRI.   He denies any flank pain,  Hematuria, or dysuria.  Left hydrocele was noted on previous imaging.  He has had multiple abdominal surgeries with remote history of incarcerated hernia.  Past Medical History:  Diagnosis Date   Blood in stool    Depression    GERD (gastroesophageal reflux disease)    Hyperlipidemia    Hypertension     Past Surgical History:  Procedure Laterality Date   APPENDECTOMY     BOWEL RESECTION     HERNIA REPAIR     Inguinal x 10 surgeries with strangulated bowel resected    Allergies: Iodinated contrast media, Iodine, Midazolam, Shellfish allergy, and Atorvastatin  Medications: Prior to Admission medications   Medication Sig Start Date End Date Taking? Authorizing Provider  atorvastatin (LIPITOR) 20 MG tablet Take 1 tablet (20 mg total) by mouth daily. 06/08/22   Janith Lima, MD  buprenorphine (SUBUTEX) 8 MG SUBL SL tablet Place under the tongue daily.    [provider]  dicyclomine (BENTYL) 10 MG capsule Take 1 capsule (10 mg total) by  mouth 3 (three) times daily before meals. 06/08/22   Janith Lima, MD  ketorolac (TORADOL) 10 MG tablet Take 10 mg by mouth every 6 (six) hours as needed.    [provider]  LORazepam (ATIVAN) 0.5 MG tablet Take 0.5 mg by mouth every 8 (eight) hours.    [provider]  Metoprolol Succinate 50 MG CS24 Take 1 tablet by mouth daily. 08/27/22   Janith Lima, MD  Omega 3 1000 MG CAPS Take by mouth.    [provider]  potassium chloride SA (KLOR-CON M15) 15 MEQ tablet Take 1 tablet (15 mEq total) by mouth 2 (two) times daily. 10/05/22   Janith Lima, MD  torsemide (DEMADEX) 10 MG tablet Take 1 tablet (10 mg total) by mouth daily. 10/05/22   Janith Lima, MD  VITAMIN D PO Take by mouth.    [provider]     Family History  Problem Relation Age of Onset   Stroke Mother    Hypertension Mother    Hyperlipidemia Mother    Heart disease Mother        CABG   Diabetes Mother    Arthritis Mother    Cancer Father        throat   Arthritis Father    Alcohol abuse Father    Hypertension Paternal Grandfather    Hyperlipidemia Paternal Grandfather    Cancer Paternal Grandfather        pituatary, throat   Alcohol abuse Paternal Grandfather     Social  History   Socioeconomic History   Marital status: Divorced    Spouse name: Not on file   Number of children: Not on file   Years of education: Not on file   Highest education level: Not on file  Occupational History   Not on file  Tobacco Use   Smoking status: Every Day    Types: Cigarettes    Passive exposure: Current   Smokeless tobacco: Never  Substance and Sexual Activity   Alcohol use: Not Currently   Drug use: Not Currently   Sexual activity: Yes    Partners: Female  Other Topics Concern   Not on file  Social History Narrative   Lives with fiance.  One child age 70.     Social Determinants of Health   Financial Resource Strain: Not on file  Food Insecurity: Not on file   Transportation Needs: Not on file  Physical Activity: Not on file  Stress: Not on file  Social Connections: Not on file    ECOG Status: 0 - Asymptomatic  Review of Systems: A 12 point ROS discussed and pertinent positives are indicated in the HPI above.  All other systems are negative.  Physical Exam Constitutional: Oriented to person, place, and time. Well-developed and well-nourished. No distress.  Last Weight  Most recent update: 11/08/2022 10:02 AM    Weight  90.7 kg (200 lb)            HENT:  Head: Normocephalic and atraumatic.  Eyes: Conjunctivae and EOM are normal. Right eye exhibits no discharge. Left eye exhibits no discharge. No scleral icterus.  Neck: No JVD present.  Pulmonary/Chest: Effort normal. No stridor. No respiratory distress.  Abdomen: soft, non distended.  Well-healed midline scar. Neurological:  alert and oriented to person, place, and time.  Skin: Skin is warm and dry.  not diaphoretic.  Psychiatric:   normal mood and affect.   behavior is normal. Judgment and thought content normal.   Review of Systems  Vital Signs: BP 139/88 (BP Location: Left Arm, Patient Position: Sitting, Cuff Size: Normal)   Pulse 78   Temp 98.3 F (36.8 C) (Oral)   Wt 90.7 kg   SpO2 96% Comment: oral  BMI 27.12 kg/m   Imaging: CT ABDOMEN AND PELVIS WITHOUT AND WITH CONTRAST  TECHNIQUE: Multidetector CT imaging of the abdomen and pelvis was performed following the standard protocol before and following the bolus administration of intravenous contrast.  RADIATION DOSE REDUCTION: This exam was performed according to the departmental dose-optimization program which includes automated exposure control, adjustment of the mA and/or kV according to patient size and/or use of iterative reconstruction technique.  CONTRAST: 125 cc of Omnipaque 300  COMPARISON: CT April 27, 2022 and ultrasound June 07, 2022.  FINDINGS: Lower chest: No acute  abnormality.  Hepatobiliary: No suspicious hepatic lesion. Gallbladder is unremarkable. No biliary ductal dilation.  Pancreas: No pancreatic ductal dilation or evidence of acute inflammation.  Spleen: No splenomegaly.  Adrenals/Urinary Tract: Bilateral adrenal glands appear normal.  No hydronephrosis. No renal, ureteral or bladder calculi.  Right upper pole renal lesion measuring 13 mm on image 26/5 demonstrates Hounsfield units of 25 pre contrast administration, 79 postcontrast administration and 80 on delayed.  Kidneys demonstrate symmetric enhancement. No collecting system duplication. No suspicious filling defect identified within the opacified portions of the collecting systems or ureters on delayed imaging.  Urinary bladder is unremarkable for degree of distension.  Stomach/Bowel: Stomach is unremarkable for degree of distension. No pathologic dilation of  small or large bowel. Small bowel small bowel anastomotic sutures in the right lower quadrant. Moderate volume of formed stool in the colon. No evidence of acute bowel inflammation.  Vascular/Lymphatic: Normal caliber abdominal aorta. Aortic atherosclerosis. Right renal vein is patent. No pathologically enlarged abdominal or pelvic lymph nodes.  Reproductive: Prostate is unremarkable.  Other: Postsurgical change in the abdominal wall. Prior right inguinal hernia repair.  Musculoskeletal: Subtle sclerotic focus in the S1 vertebral body on image 105/602 is present dating back to October 14, 2013 compatible with a benign/indolent process. No aggressive lytic or blastic lesion of bone. Multilevel degenerative changes spine. Degenerative change of the bilateral hips.  IMPRESSION: 1. Partially exophytic 13 mm right upper pole renal lesion demonstrates postcontrast enhancement, suspicious for a small papillary type renal cell carcinoma. 2. Evidence of tumor in renal vein or abdominopelvic metastatic disease. 3. No  hydronephrosis. No renal, ureteral or bladder calculi. 4. Moderate volume of formed stool in the colon. Correlate for constipation. 5. Aortic Atherosclerosis (ICD10-I70.0).  These results will be called to the ordering clinician or representative by the Radiologist Assistant, and communication documented in the PACS or [employer].   Electronically Signed By: Dahlia Bailiff M.D. On: 10/26/2022 10:14   Labs:  CBC: Recent Labs    04/27/22 1032 06/07/22 1038  WBC 13.9* 10.2  HGB 16.4 13.7  HCT 49.0 40.1  PLT 336 248.0    COAGS: No results for input(s): "INR", "APTT" in the last 8760 hours.  BMP: Recent Labs    04/27/22 1032 06/07/22 1038 10/04/22 1158  NA 137 136 139  K 3.3* 3.5 4.1  CL 98 98 101  CO2 25 31 31  $ GLUCOSE 113* 96 113*  BUN 14 13 11  $ CALCIUM 9.8 9.3 9.3  CREATININE 0.97 0.86 0.86  GFRNONAA >60  --   --     LIVER FUNCTION TESTS: Recent Labs    04/27/22 1032  BILITOT 1.0  AST 36  ALT 31  ALKPHOS 81  PROT 8.1  ALBUMIN 4.3    TUMOR MARKERS: No results for input(s): "AFPTM", "CEA", "CA199", "CHROMGRNA" in the last 8760 hours.  Assessment and Plan:  My impression is that this patient has a 1.3 cm probably enhancing right upper pole renal lesion consistent with renal cell carcinoma.    We discussed this in detail and in regards to the spectrum of renal masses which includes cysts (pure cysts are considered benign), solid masses and everything in between. The risk of metastasis increases as the size of solid renal mass increases. In general, it is believed that the risk of metastasis for renal masses less than 3-4 cm is small (up to approximately 5%) based mainly on large retrospective studies.  In some cases and especially in patients of older age and multiple comorbidities a surveillance approach may be appropriate.   Active surveillance could also be considered given that this lesion is less than 3 cm to help further assess growth rate,  etc. The treatment of solid renal masses includes:  cryoablation (percutaneous and laparoscopic) in addition to partial and complete nephrectomy (each with option of laparoscopic, robotic and open depending on appropriateness). Furthermore, nephrectomy appears to be an independent risk factor for the development of chronic kidney disease suggesting that nephron sparing approaches should be implored whenever feasible. We reviewed these options in context of the patients current situation as well as the pros and cons of each.  We had   discussion today about the risk and benefits of each.  We discussed in particular the percutaneous cryoablation procedure with CT guidance under anesthesia, anticipated benefits, possible risks and complications, expected postoperative course,  and need for continued imaging follow-up (CT or MR) assuming this is  carcinoma. He would probably need hydrodissection to protect the liver as it is in proximity to the lesion. The patient and significant otherseemed to understand and did ask appropriate questions. He had had a good discussion with Dr. Abner Greenspan already about this. He is scheduled to see Dr Abner Greenspan for further discussion before deciding how to proceed. SHould he elect to proceed with cryoablation,  we can set   up for CT-guided   renal mass cryoablation  under anesthesia at his convenience.    Thank you for this interesting consult.  I greatly enjoyed meeting BLAIZ KONRAD and look forward to participating in their care.  A copy of this report was sent to the requesting provider on this date.  Electronically Signed: Rickard Rhymes 11/08/2022, 10:41 AM   I spent a total of  40 Minutes   in face to face in clinical consultation, greater than 50% of which was counseling/coordinating care for Right renal mass.

## 2022-12-07 ENCOUNTER — Encounter: Payer: Self-pay | Admitting: Urology

## 2022-12-07 ENCOUNTER — Other Ambulatory Visit: Payer: Self-pay | Admitting: Internal Medicine

## 2022-12-07 DIAGNOSIS — E785 Hyperlipidemia, unspecified: Secondary | ICD-10-CM

## 2022-12-09 ENCOUNTER — Other Ambulatory Visit (HOSPITAL_COMMUNITY): Payer: Self-pay | Admitting: Interventional Radiology

## 2022-12-09 DIAGNOSIS — N2889 Other specified disorders of kidney and ureter: Secondary | ICD-10-CM

## 2023-01-10 ENCOUNTER — Other Ambulatory Visit: Payer: Self-pay | Admitting: Internal Medicine

## 2023-01-10 ENCOUNTER — Encounter: Payer: Self-pay | Admitting: Internal Medicine

## 2023-01-10 ENCOUNTER — Telehealth (HOSPITAL_COMMUNITY): Payer: Self-pay | Admitting: Student

## 2023-01-10 DIAGNOSIS — T782XXA Anaphylactic shock, unspecified, initial encounter: Secondary | ICD-10-CM | POA: Insufficient documentation

## 2023-01-10 MED ORDER — EPINEPHRINE 0.3 MG/0.3ML IJ SOAJ: 0.3000 mg | INTRAMUSCULAR | 3 refills | Status: AC | PRN

## 2023-01-10 MED ORDER — PREDNISONE 50 MG PO TABS: ORAL_TABLET | ORAL | 0 refills | Status: DC

## 2023-01-10 NOTE — Telephone Encounter (Signed)
Patient scheduled for renal cryoablation with Dr. Deanne Coffer 01/14/23 at 0830. Patient has a contrast allergy and the prednisone regimen has been e-prescribed to the [facility] on [address]. The patient is familiar with this regimen. He will take one tablet 13 hours, 7 hours and 1 hour prior to his procedure. One hour prior to his procedure he will also take 50 mg of benadryl. The patient states he has benadryl at home.   The patient knows he can call our office with any questions/concerns prior to his procedure.  Alwyn Ren, [place] [phone] 01/10/2023, 3:27 PM

## 2023-01-11 ENCOUNTER — Emergency Department (HOSPITAL_COMMUNITY): Payer: Medicare Other

## 2023-01-11 ENCOUNTER — Other Ambulatory Visit: Payer: Self-pay

## 2023-01-11 ENCOUNTER — Emergency Department (HOSPITAL_COMMUNITY)
Admission: EM | Admit: 2023-01-11 | Discharge: 2023-01-11 | Disposition: A | Discharge status: Home / Self Care | Payer: Medicare Other | Attending: Emergency Medicine | Admitting: Emergency Medicine

## 2023-01-11 DIAGNOSIS — Z79899 Other long term (current) drug therapy: Secondary | ICD-10-CM | POA: Diagnosis not present

## 2023-01-11 DIAGNOSIS — R1084 Generalized abdominal pain: Secondary | ICD-10-CM

## 2023-01-11 DIAGNOSIS — I10 Essential (primary) hypertension: Secondary | ICD-10-CM | POA: Diagnosis not present

## 2023-01-11 DIAGNOSIS — R1031 Right lower quadrant pain: Secondary | ICD-10-CM | POA: Diagnosis present

## 2023-01-11 LAB — CBC WITH DIFFERENTIAL/PLATELET
Abs Immature Granulocytes: 0.03 10*3/uL (ref 0.00–0.07)
Basophils Absolute: 0 10*3/uL (ref 0.0–0.1)
Basophils Relative: 0 %
Eosinophils Absolute: 0.2 10*3/uL (ref 0.0–0.5)
Eosinophils Relative: 2 %
HCT: 42.4 % (ref 39.0–52.0)
Hemoglobin: 14 g/dL (ref 13.0–17.0)
Immature Granulocytes: 0 %
Lymphocytes Relative: 32 %
Lymphs Abs: 3.1 10*3/uL (ref 0.7–4.0)
MCH: 28.5 pg (ref 26.0–34.0)
MCHC: 33 g/dL (ref 30.0–36.0)
MCV: 86.2 fL (ref 80.0–100.0)
Monocytes Absolute: 0.6 10*3/uL (ref 0.1–1.0)
Monocytes Relative: 6 %
Neutro Abs: 5.8 10*3/uL (ref 1.7–7.7)
Neutrophils Relative %: 60 %
Platelets: 278 10*3/uL (ref 150–400)
RBC: 4.92 MIL/uL (ref 4.22–5.81)
RDW: 12.4 % (ref 11.5–15.5)
WBC: 9.7 10*3/uL (ref 4.0–10.5)
nRBC: 0 % (ref 0.0–0.2)

## 2023-01-11 LAB — URINALYSIS, ROUTINE W REFLEX MICROSCOPIC
Bilirubin Urine: NEGATIVE
Glucose, UA: NEGATIVE mg/dL
Hgb urine dipstick: NEGATIVE
Ketones, ur: NEGATIVE mg/dL
Leukocytes,Ua: NEGATIVE
Nitrite: NEGATIVE
Protein, ur: NEGATIVE mg/dL
Specific Gravity, Urine: 1.009 (ref 1.005–1.030)
pH: 5 (ref 5.0–8.0)

## 2023-01-11 LAB — COMPREHENSIVE METABOLIC PANEL
ALT: 32 U/L (ref 0–44)
AST: 44 U/L — ABNORMAL HIGH (ref 15–41)
Albumin: 3.7 g/dL (ref 3.5–5.0)
Alkaline Phosphatase: 74 U/L (ref 38–126)
Anion gap: 11 (ref 5–15)
BUN: 11 mg/dL (ref 6–20)
CO2: 25 mmol/L (ref 22–32)
Calcium: 9.3 mg/dL (ref 8.9–10.3)
Chloride: 104 mmol/L (ref 98–111)
Creatinine, Ser: 0.87 mg/dL (ref 0.61–1.24)
GFR, Estimated: >60 mL/min (ref >60)
Glucose, Bld: 124 mg/dL — ABNORMAL HIGH (ref 70–99)
Potassium: 3.9 mmol/L (ref 3.5–5.1)
Sodium: 140 mmol/L (ref 135–145)
Total Bilirubin: 1 mg/dL (ref 0.3–1.2)
Total Protein: 6.9 g/dL (ref 6.5–8.1)

## 2023-01-11 LAB — LIPASE, BLOOD: Lipase: 26 U/L (ref 11–51)

## 2023-01-11 MED ORDER — MORPHINE SULFATE (PF) 4 MG/ML IV SOLN
4.0000 mg | Freq: Once | INTRAVENOUS | Status: AC
  Administered 2023-01-11: 4 mg via INTRAVENOUS
  Filled 2023-01-11: qty 1

## 2023-01-11 MED ORDER — OXYCODONE-ACETAMINOPHEN 5-325 MG PO TABS: 1.0000 | ORAL_TABLET | Freq: Once | ORAL | Status: DC

## 2023-01-11 MED ORDER — SODIUM CHLORIDE 0.9 % IV BOLUS
1000.0000 mL | Freq: Once | INTRAVENOUS | Status: AC
  Administered 2023-01-11: 1000 mL via INTRAVENOUS

## 2023-01-11 MED ORDER — ONDANSETRON HCL 4 MG/2ML IJ SOLN
4.0000 mg | Freq: Once | INTRAMUSCULAR | Status: AC
  Administered 2023-01-11: 4 mg via INTRAVENOUS
  Filled 2023-01-11: qty 2

## 2023-01-11 NOTE — ED Triage Notes (Signed)
Pt has been having RLQ abd pain for years. Pt has had 2 bowel resections and multiple hernia surgeries. Pt has had pain since surgeries. Tonight at midnight pain became unbearable. 9/10 pain. Endorses nausea. No vomiting or diarrhea. No fevers at home

## 2023-01-11 NOTE — ED Provider Triage Note (Signed)
  Emergency Medicine Provider Triage Evaluation Note  MRN:  [id number]  Arrival date & time: 01/11/23    Medically screening exam initiated at 6:09 AM.   CC:   Abdominal Pain   HPI:  Wyatt Murray is a 47 y.o. year-old male presents to the ED with chief complaint of abdominal pain.  Onset 6 hours ago.  Denies fever or chills.  Reports nausea, but no vomiting.  Hx of SBO.  Hx of multiple prior abdominal surgeries.  History provided by patient ROS:  -As included in HPI PE:   Vitals:   01/11/23 0600  BP: (!) 145/96  Resp: 16  Temp: 98.2 F (36.8 C)  SpO2: 99%    Non-toxic appearing No respiratory distress  MDM:  Abdominal pain I've ordered labs and imaging in triage to expedite lab/diagnostic workup.  Patient was informed that the remainder of the evaluation will be completed by another provider, this initial triage assessment does not replace that evaluation, and the importance of remaining in the ED until their evaluation is complete.    Roxy Horseman, PA-C 01/11/23 [phone]

## 2023-01-11 NOTE — ED Provider Notes (Signed)
[place] EMERGENCY DEPARTMENT AT [facility] Provider Note   CSN: [id number] Arrival date & time: 01/11/23  0550     History  Chief Complaint  Patient presents with   Wyatt Murray    TARVARIS PUGLIA is a 47 y.o. male.  Pt is a 47 yo male with pmhx significant for chronic abd Murray, depression, hld, htn, and gerd.  Pt said he has had multiple abd surgeries and is worried he has another blockage.  He usually takes subutex for Murray, but his Murray started getting worse last night.  Pt denies f/c.  No n/v.       Home Medications Prior to Admission medications   Medication Sig Start Date End Date Taking? Authorizing Provider  atorvastatin (LIPITOR) 20 MG tablet TAKE 1 TABLET(20 MG) BY MOUTH DAILY 12/07/22   Etta Grandchild, MD  buprenorphine (SUBUTEX) 8 MG SUBL SL tablet Place under the tongue daily.    [provider]  dicyclomine (BENTYL) 10 MG capsule Take 1 capsule (10 mg total) by mouth 3 (three) times daily before meals. 06/08/22   Etta Grandchild, MD  EPINEPHrine (EPIPEN 2-PAK) 0.3 mg/0.3 mL IJ SOAJ injection Inject 0.3 mg into the muscle as needed for anaphylaxis. 01/10/23   Etta Grandchild, MD  ketorolac (TORADOL) 10 MG tablet Take 10 mg by mouth every 6 (six) hours as needed.    [provider]  LORazepam (ATIVAN) 0.5 MG tablet Take 0.5 mg by mouth every 8 (eight) hours.    [provider]  Metoprolol Succinate 50 MG CS24 Take 1 tablet by mouth daily. 08/27/22   Etta Grandchild, MD  Omega 3 1000 MG CAPS Take by mouth.    [provider]  potassium chloride SA (KLOR-CON M15) 15 MEQ tablet Take 1 tablet (15 mEq total) by mouth 2 (two) times daily. 10/05/22   Etta Grandchild, MD  predniSONE (DELTASONE) 50 MG tablet Take one tablet at 13 hours, 7 hours and 1 hour prior to your procedure. 01/10/23   Mickie Kay, NP  torsemide (DEMADEX) 10 MG tablet Take 1 tablet (10 mg total) by mouth daily. 10/05/22   Etta Grandchild, MD  VITAMIN D  PO Take by mouth.    [provider]      Allergies    Iodinated contrast media, Iodine, Midazolam, Shellfish allergy, and Atorvastatin    Review of Systems   Review of Systems  Gastrointestinal:  Positive for Wyatt Murray.  All other systems reviewed and are negative.   Physical Exam Updated Vital Signs BP (!) 138/92   Pulse 92   Temp 98 F (36.7 C) (Oral)   Resp 18   Ht 6' (1.829 m)   Wt 90.7 kg   SpO2 96%   BMI 27.12 kg/m  Physical Exam Vitals and nursing note reviewed.  Constitutional:      Appearance: He is well-developed.  HENT:     Head: Normocephalic and atraumatic.     Mouth/Throat:     Mouth: Mucous membranes are moist.     Pharynx: Oropharynx is clear.  Eyes:     Extraocular Movements: Extraocular movements intact.     Pupils: Pupils are equal, round, and reactive to light.  Cardiovascular:     Rate and Rhythm: Normal rate and regular rhythm.  Wyatt:     General: Abdomen is flat. Bowel sounds are normal.     Palpations: Abdomen is soft.     Tenderness: There is generalized  Wyatt tenderness.  Skin:    General: Skin is warm.     Capillary Refill: Capillary refill takes less than 2 seconds.  Neurological:     General: No focal deficit present.     Mental Status: He is alert and oriented to person, place, and time.  Psychiatric:        Mood and Affect: Mood normal.        Behavior: Behavior normal.     ED Results / Procedures / Treatments   Labs (all labs ordered are listed, but only abnormal results are displayed) Labs Reviewed  COMPREHENSIVE METABOLIC PANEL - Abnormal; Notable for the following components:      Result Value   Glucose, Bld 124 (*)    AST 44 (*)    All other components within normal limits  LIPASE, BLOOD  CBC WITH DIFFERENTIAL/PLATELET  URINALYSIS, ROUTINE W REFLEX MICROSCOPIC    EKG None  Radiology CT ABDOMEN PELVIS WO CONTRAST  Result Date: 01/11/2023 CLINICAL DATA:  Wyatt Murray. EXAM: CT ABDOMEN  AND PELVIS WITHOUT CONTRAST TECHNIQUE: Multidetector CT imaging of the abdomen and pelvis was performed following the standard protocol without IV contrast. RADIATION DOSE REDUCTION: This exam was performed according to the departmental dose-optimization program which includes automated exposure control, adjustment of the mA and/or kV according to patient size and/or use of iterative reconstruction technique. COMPARISON:  10/25/2022 FINDINGS: Lower chest: The lung bases are clear of acute process. No pleural effusion or pulmonary lesions. The heart is normal in size. No pericardial effusion. The distal esophagus and aorta are unremarkable. Hepatobiliary: No hepatic lesions or intrahepatic biliary dilatation. The gallbladder is unremarkable. No common bile duct dilatation. Pancreas: No mass, inflammation or ductal dilatation. Spleen: Normal size.  No focal lesions. Adrenals/Urinary Tract: The adrenal glands are normal. Stable 14 mm lesion involving the upper pole region of the right kidney. This showed enhancement the prior CT scan and is suspicious for small renal neoplasm. Recommend urology consultation. No renal, ureteral or bladder calculi. No obvious bladder lesions without contrast. Stomach/Bowel: The stomach, duodenum, small bowel and colon are unremarkable. No acute inflammatory changes, mass lesions or obstructive findings. Stable surgical changes involving the small bowel. The terminal ileum is unremarkable. The appendix is surgically absent. Vascular/Lymphatic: Scattered atherosclerotic calcifications involving the distal aorta and iliac arteries but no aneurysm. Reproductive: The prostate gland and seminal vesicles are unremarkable. Other: No pelvic mass or adenopathy. No free pelvic fluid collections. No inguinal mass or adenopathy. Surgical changes from prior Wyatt surgery involving the anterior Wyatt. Musculoskeletal: No significant bony findings. IMPRESSION: 1. No acute Wyatt/pelvic  findings or adenopathy. 2. Stable 14 mm upper pole right renal lesion as detailed above. Recommend urology consultation. 3. No renal, ureteral or bladder calculi. 4. Stable surgical changes involving the small bowel. No findings for small obstruction. Aortic Atherosclerosis (ICD10-I70.0). Electronically Signed   By: Rudie Meyer M.D.   On: 01/11/2023 07:17    Procedures Procedures    Medications Ordered in ED Medications  sodium chloride 0.9 % bolus 1,000 mL (1,000 mLs Intravenous New Bag/Given 01/11/23 0935)  morphine (PF) 4 MG/ML injection 4 mg (4 mg Intravenous Given 01/11/23 0935)  ondansetron (ZOFRAN) injection 4 mg (4 mg Intravenous Given 01/11/23 1191)    ED Course/ Medical Decision Making/ A&P                             Medical Decision Making Risk Prescription drug management.  This patient presents to the ED for concern of abd Murray, this involves an extensive number of treatment options, and is a complaint that carries with it a high risk of complications and morbidity.  The differential diagnosis includes sbo, infection, electrolyte abn   Co morbidities that complicate the patient evaluation  chronic abd Murray, depression, hld, htn, and gerd   Additional history obtained:  Additional history obtained from epic chart review  Lab Tests:  I Ordered, and personally interpreted labs.  The pertinent results include:  cbc nl, cmp nl, ua nl, lip nl   Imaging Studies ordered:  I ordered imaging studies including ct abd/pelvis  I independently visualized and interpreted imaging which showed  No acute Wyatt/pelvic findings or adenopathy.  2. Stable 14 mm upper pole right renal lesion as detailed above.  Recommend urology consultation.  3. No renal, ureteral or bladder calculi.  4. Stable surgical changes involving the small bowel. No findings  for small obstruction.    Aortic Atherosclerosis (ICD10-I70.0).   I agree with the radiologist  interpretation   Cardiac Monitoring:  The patient was maintained on a cardiac monitor.  I personally viewed and interpreted the cardiac monitored which showed an underlying rhythm of: nsr   Medicines ordered and prescription drug management:  I ordered medication including ivfs/morphine  for sx  Reevaluation of the patient after these medicines showed that the patient improved I have reviewed the patients home medicines and have made adjustments as needed   Test Considered:  ct   Critical Interventions:  Murray control   Problem List / ED Course:  Abd Murray:  likely an exacerbation of his chronic Murray.  He is feeling better after treatment.  Labs and ct without anything acute. Renal lesion:  pt is scheduled for a renal cryoablation on 4/19 by Dr. Deanne Coffer (IR), so this is a known problem   Reevaluation:  After the interventions noted above, I reevaluated the patient and found that they have :improved   Social Determinants of Health:  Lives at home   Dispostion:  After consideration of the diagnostic results and the patients response to treatment, I feel that the patent would benefit from discharge with outpatient f/u.          Final Clinical Impression(s) / ED Diagnoses Final diagnoses:  Generalized Wyatt Murray    Rx / DC Orders ED Discharge Orders     None         Jacalyn Lefevre, MD 01/11/23 1003

## 2023-01-12 ENCOUNTER — Other Ambulatory Visit: Payer: Self-pay | Admitting: Student

## 2023-01-12 ENCOUNTER — Telehealth: Payer: Self-pay

## 2023-01-12 NOTE — Transitions of Care (Post Inpatient/ED Visit) (Signed)
   01/12/2023  Name: Wyatt Murray MRN: [id number] DOB: Jan 14, 1976  Today's TOC FU Call Status: Today's TOC FU Call Status:: Successful TOC FU Call Competed TOC FU Call Complete Date: 01/12/23  Transition Care Management Follow-up Telephone Call Date of Discharge: 01/11/23 Discharge Facility: [facility] Type of Discharge: Emergency Department Reason for ED Visit: Other: (abd pain) How have you been since you were released from the hospital?: Better Any questions or concerns?: No  Items Reviewed: Did you receive and understand the discharge instructions provided?: Yes Medications obtained and verified?: Yes (Medications Reviewed) Any new allergies since your discharge?: No Dietary orders reviewed?: Yes Do you have support at home?: No  Home Care and Equipment/Supplies: Were Home Health Services Ordered?: NA Any new equipment or medical supplies ordered?: NA  Functional Questionnaire: Do you need assistance with bathing/showering or dressing?: No Do you need assistance with meal preparation?: No Do you need assistance with eating?: No Do you have difficulty maintaining continence: No Do you need assistance with getting out of bed/getting out of a chair/moving?: No Do you have difficulty managing or taking your medications?: No  Follow up appointments reviewed: PCP Follow-up appointment confirmed?: NA Specialist Hospital Follow-up appointment confirmed?: NA Do you need transportation to your follow-up appointment?: No Do you understand care options if your condition(s) worsen?: Yes-patient verbalized understanding    SIGNATURE Karena Addison, LPN [facility] Nurse Health Advisor Direct Dial [phone]

## 2023-01-13 ENCOUNTER — Other Ambulatory Visit: Payer: Self-pay | Admitting: Radiology

## 2023-01-13 ENCOUNTER — Encounter (HOSPITAL_COMMUNITY): Payer: Self-pay | Admitting: Interventional Radiology

## 2023-01-13 ENCOUNTER — Other Ambulatory Visit: Payer: Self-pay

## 2023-01-13 DIAGNOSIS — N2889 Other specified disorders of kidney and ureter: Secondary | ICD-10-CM

## 2023-01-13 NOTE — Progress Notes (Signed)
Mr. Isadore denies chest pain or shortness of breath. Patient denies having any s/s of Covid in his household, also denies any known exposure to Covid.   Mr. Fiero denies any s/s of upper or lower respiratory in the past 8 weeks.   Mr. Abdou's PCP is Dr. Yetta Barre, patient was sent to see Dr. Antoine Poche to check out RBB, Dr. Kirtland Bouchard said it is not anything he should be concerned about. Follow up if needed.  Mr. Goldammer is allergic to contrast dye, patient has instructions from Dr. Dereck Ligas nurse to take Prednisone, last dose will be at  0700,  and Benadryl.

## 2023-01-13 NOTE — H&P (Signed)
Chief Complaint: Right upper pole renal mass. Patient presents for Cryoablation with general anesthesia  Referring Physician(s): Dr. Jettie Pagan  Supervising Physician: Oley Balm  Patient Status: [facility] - Out-pt  History of Present Illness: Wyatt Murray is a 47 y.o. male Outpatient. History of HTN, HLD, GERD, IBS. Found to have a lesion in the upper pole of his right kidney while being worked up for hydrocele. Most recent imaging obtained when Wyatt Murray presented to the Emergency Department at [facility] for abdominal pain on January 11, 2023. CT Abd and pelvis from January 11, 2023 reads stable. 14 mm involving the upper pole of the right kidney. This showed enhancement on prior CT and is suspicious for small renal cell neoplasm. Patient was seen in the Interventional Radiology clinic on November 08 2022 by IR attending Dr Edwyna Ready. At that time a detailed discussion regarding the Patient's medical condition including but not limited to possible treatment options took place. After some thought and discussion with his urologist, Dr. Jettie Pagan, the Patient has elected to move forward with this procedure. The patient presents for CT guided right upper pole renal mass cryoablation with general anesthesia.  Fiancee at bedside. Patient alert and laying in bed,calm. Endorses generalized abd pain. Denies any fevers, headache, chest pain, SOB, cough, nausea, vomiting or bleeding. Return precautions and treatment recommendations and follow-up discussed with the patient  who is agreeable with the plan.    Past Medical History:  Diagnosis Date   Blood in stool    GERD (gastroesophageal reflux disease)    Hyperlipidemia    Hypertension    Pre-diabetes    Substance abuse    pain meds    Past Surgical History:  Procedure Laterality Date   APPENDECTOMY     BOWEL RESECTION     HERNIA REPAIR     Inguinal x 10 surgeries with strangulated bowel resected    Allergies: Iodinated  contrast media, Iodine, Midazolam, Shellfish allergy, and Atorvastatin  Medications: Prior to Admission medications   Medication Sig Start Date End Date Taking? Authorizing Provider  atorvastatin (LIPITOR) 20 MG tablet TAKE 1 TABLET(20 MG) BY MOUTH DAILY 12/07/22  Yes Etta Grandchild, MD  buprenorphine (SUBUTEX) 8 MG SUBL SL tablet Place 8 mg under the tongue 2 (two) times daily.   Yes [provider]  dicyclomine (BENTYL) 10 MG capsule Take 1 capsule (10 mg total) by mouth 3 (three) times daily before meals. Patient taking differently: Take 10 mg by mouth 3 (three) times daily as needed (Stomach pain). 06/08/22  Yes Etta Grandchild, MD  EPINEPHrine (EPIPEN 2-PAK) 0.3 mg/0.3 mL IJ SOAJ injection Inject 0.3 mg into the muscle as needed for anaphylaxis. 01/10/23  Yes Etta Grandchild, MD  ergocalciferol (VITAMIN D2) 1.25 MG (50000 UT) capsule Take 50,000 Units by mouth once a week.   Yes [provider]  lidocaine (LIDODERM) 5 % Place 1 patch onto the skin daily as needed (pain). Remove & Discard patch within 12 hours or as directed by MD   Yes [provider]  Metoprolol Succinate 50 MG CS24 Take 1 tablet by mouth daily. 08/27/22  Yes Etta Grandchild, MD  Omega 3 1000 MG CAPS Take 1,000 mg by mouth daily.   Yes [provider]  potassium chloride SA (KLOR-CON M15) 15 MEQ tablet Take 1 tablet (15 mEq total) by mouth 2 (two) times daily. 10/05/22  Yes Etta Grandchild, MD  torsemide (DEMADEX) 10 MG tablet Take  1 tablet (10 mg total) by mouth daily. 10/05/22  Yes Etta Grandchild, MD  predniSONE (DELTASONE) 50 MG tablet Take one tablet at 13 hours, 7 hours and 1 hour prior to your procedure. Patient not taking: Reported on 01/12/2023 01/10/23   Mickie Kay, NP     Family History  Problem Relation Age of Onset   Stroke Mother    Hypertension Mother    Hyperlipidemia Mother    Heart disease Mother        CABG   Diabetes Mother    Arthritis Mother    Cancer Father         throat   Arthritis Father    Alcohol abuse Father    Hypertension Paternal Grandfather    Hyperlipidemia Paternal Grandfather    Cancer Paternal Grandfather        pituatary, throat   Alcohol abuse Paternal Grandfather     Social History   Socioeconomic History   Marital status: Divorced    Spouse name: Not on file   Number of children: Not on file   Years of education: Not on file   Highest education level: Not on file  Occupational History   Not on file  Tobacco Use   Smoking status: Former    Types: Cigarettes    Quit date: 11/02/2021    Years since quitting: 1.1    Passive exposure: Current   Smokeless tobacco: Never  Vaping Use   Vaping Use: Every day   Substances: Nicotine  Substance and Sexual Activity   Alcohol use: Not Currently    Comment: 01/12/23   Drug use: Not Currently   Sexual activity: Yes    Partners: Female  Other Topics Concern   Not on file  Social History Narrative   Lives with fiance.  One child age 53.     Social Determinants of Health   Financial Resource Strain: Not on file  Food Insecurity: Not on file  Transportation Needs: Not on file  Physical Activity: Not on file  Stress: Not on file  Social Connections: Not on file    Review of Systems: A 12 point ROS discussed and pertinent positives are indicated in the HPI above.  All other systems are negative.  Review of Systems  Constitutional:  Negative for fever.  HENT:  Negative for congestion.   Respiratory:  Negative for cough and shortness of breath.   Cardiovascular:  Negative for chest pain.  Gastrointestinal:  Positive for abdominal pain.  Neurological:  Negative for headaches.  Psychiatric/Behavioral:  Negative for behavioral problems and confusion.     Vital Signs: There were no vitals taken for this visit.    Physical Exam Vitals and nursing note reviewed.  Constitutional:      Appearance: He is well-developed.  HENT:     Head: Normocephalic.   Cardiovascular:     Rate and Rhythm: Normal rate and regular rhythm.     Heart sounds: Normal heart sounds.  Pulmonary:     Effort: Pulmonary effort is normal.     Breath sounds: Normal breath sounds.  Musculoskeletal:        General: Normal range of motion.     Cervical back: Normal range of motion.  Skin:    General: Skin is warm and dry.  Neurological:     General: No focal deficit present.     Mental Status: He is alert and oriented to person, place, and time.  Psychiatric:  Mood and Affect: Mood normal.        Behavior: Behavior normal.        Thought Content: Thought content normal.        Judgment: Judgment normal.     Imaging: CT ABDOMEN PELVIS WO CONTRAST  Result Date: 01/11/2023 CLINICAL DATA:  Abdominal pain. EXAM: CT ABDOMEN AND PELVIS WITHOUT CONTRAST TECHNIQUE: Multidetector CT imaging of the abdomen and pelvis was performed following the standard protocol without IV contrast. RADIATION DOSE REDUCTION: This exam was performed according to the departmental dose-optimization program which includes automated exposure control, adjustment of the mA and/or kV according to patient size and/or use of iterative reconstruction technique. COMPARISON:  10/25/2022 FINDINGS: Lower chest: The lung bases are clear of acute process. No pleural effusion or pulmonary lesions. The heart is normal in size. No pericardial effusion. The distal esophagus and aorta are unremarkable. Hepatobiliary: No hepatic lesions or intrahepatic biliary dilatation. The gallbladder is unremarkable. No common bile duct dilatation. Pancreas: No mass, inflammation or ductal dilatation. Spleen: Normal size.  No focal lesions. Adrenals/Urinary Tract: The adrenal glands are normal. Stable 14 mm lesion involving the upper pole region of the right kidney. This showed enhancement the prior CT scan and is suspicious for small renal neoplasm. Recommend urology consultation. No renal, ureteral or bladder calculi. No  obvious bladder lesions without contrast. Stomach/Bowel: The stomach, duodenum, small bowel and colon are unremarkable. No acute inflammatory changes, mass lesions or obstructive findings. Stable surgical changes involving the small bowel. The terminal ileum is unremarkable. The appendix is surgically absent. Vascular/Lymphatic: Scattered atherosclerotic calcifications involving the distal aorta and iliac arteries but no aneurysm. Reproductive: The prostate gland and seminal vesicles are unremarkable. Other: No pelvic mass or adenopathy. No free pelvic fluid collections. No inguinal mass or adenopathy. Surgical changes from prior abdominal surgery involving the anterior abdominal. Musculoskeletal: No significant bony findings. IMPRESSION: 1. No acute abdominal/pelvic findings or adenopathy. 2. Stable 14 mm upper pole right renal lesion as detailed above. Recommend urology consultation. 3. No renal, ureteral or bladder calculi. 4. Stable surgical changes involving the small bowel. No findings for small obstruction. Aortic Atherosclerosis (ICD10-I70.0). Electronically Signed   By: Rudie Meyer M.D.   On: 01/11/2023 07:17    Labs:  CBC: Recent Labs    04/27/22 1032 06/07/22 1038 01/11/23 0610  WBC 13.9* 10.2 9.7  HGB 16.4 13.7 14.0  HCT 49.0 40.1 42.4  PLT 336 248.0 278    COAGS: No results for input(s): "INR", "APTT" in the last 8760 hours.  BMP: Recent Labs    04/27/22 1032 06/07/22 1038 10/04/22 1158 01/11/23 0610  NA 137 136 139 140  K 3.3* 3.5 4.1 3.9  CL 98 98 101 104  CO2 GLUCOSE 113* 96 113* 124*  BUN CALCIUM 9.8 9.3 9.3 9.3  CREATININE 0.97 0.86 0.86 0.87  GFRNONAA >60  --   --  >60    LIVER FUNCTION TESTS: Recent Labs    04/27/22 1032 01/11/23 0610  BILITOT 1.0 1.0  AST 36 44*  ALT 31 32  ALKPHOS 81 74  PROT 8.1 6.9  ALBUMIN 4.3 3.7    Assessment and Plan:  47 year-old male. Outpatient. History of HTN, HLD, GERD, IBS. Found to have  a lesion in the upper pole of his right kidney while being worked up for hydrocele. Most recent imaging obtained when Mr. Ramirez presented to the Emergency Department at [facility] for abdominal  pain on January 11, 2023. CT Abd and pelvis from January 11, 2023 reads stable. 14 mm involving the upper pole of the right kidney. This showed enhancement on prior CT and is suspicious for small renal cell neoplasm. Patient was seen in the Interventional Radiology clinic on November 08 2022 by IR attending Dr Edwyna Ready. At that time a detailed discussion regarding the Patient's medical condition including but not limited to possible treatment options took place. After some thought and discussion with his urologist, Dr. Jettie Pagan, the Patient has elected to move forward with this procedure. The patient presents for CT guided right upper pole renal mass cryoablation with general anesthesia.   All labs and medications are within a acceptable parameters. Allergies include Iodinated contrast media. Reaction, hives and shortness of breath. Patient has been prescribed allergy medication and has verbalized taking the prednisone and Benadryl as instructed per protocol. Patient has been NPO since midnight.  The Risks and benefits of embolization were discussed with the patient including, but not limited to bleeding, infection, vascular injury, post operative pain, or contrast induced renal failure.  This procedure involves the use of X-rays and because of the nature of the planned procedure, it is possible that we will have prolonged use of X-ray fluoroscopy.  Potential radiation risks to you include (but are not limited to) the following: - A slightly elevated risk for cancer several years later in life. This risk is typically less than 0.5% percent. This risk is low in comparison to the normal incidence of human cancer, which is 33% for women and 50% for men according to the [employer] Cancer Society. - Radiation  induced injury can include skin redness, resembling a rash, tissue breakdown / ulcers and hair loss (which can be temporary or permanent).   The likelihood of either of these occurring depends on the difficulty of the procedure and whether you are sensitive to radiation due to previous procedures, disease, or genetic conditions.   IF your procedure requires a prolonged use of radiation, you will be notified and given written instructions for further action.  It is your responsibility to monitor the irradiated area for the 2 weeks following the procedure and to notify your physician if you are concerned that you have suffered a radiation induced injury.    All of the patient's questions were answered, patient is agreeable to proceed. Consent signed and in chart.   Thank you for this interesting consult.  I greatly enjoyed meeting KUNAAL WALKINS and look forward to participating in their care.  A copy of this report was sent to the requesting provider on this date.  Electronically Signed: Alene Mires, NP 01/13/2023, 8:19 PM   I spent a total of    25 Minutes in face to face in clinical consultation, greater than 50% of which was counseling/coordinating care for right upper pole renal cyroablation.

## 2023-01-14 ENCOUNTER — Ambulatory Visit (HOSPITAL_COMMUNITY)
Admission: RE | Admit: 2023-01-14 | Discharge: 2023-01-14 | Disposition: A | Discharge status: Home / Self Care | Payer: Medicare Other | Source: Ambulatory Visit | Attending: Interventional Radiology | Admitting: Interventional Radiology

## 2023-01-14 ENCOUNTER — Other Ambulatory Visit: Payer: Self-pay

## 2023-01-14 ENCOUNTER — Encounter (HOSPITAL_COMMUNITY)
Admission: RE | Disposition: A | Discharge status: Home / Self Care | Payer: Self-pay | Source: Home / Self Care | Attending: Interventional Radiology

## 2023-01-14 ENCOUNTER — Encounter (HOSPITAL_COMMUNITY): Payer: Self-pay | Admitting: Interventional Radiology

## 2023-01-14 ENCOUNTER — Ambulatory Visit (HOSPITAL_COMMUNITY): Payer: Medicare Other | Admitting: Physician Assistant

## 2023-01-14 ENCOUNTER — Ambulatory Visit (HOSPITAL_BASED_OUTPATIENT_CLINIC_OR_DEPARTMENT_OTHER): Payer: Medicare Other | Admitting: Physician Assistant

## 2023-01-14 ENCOUNTER — Ambulatory Visit (HOSPITAL_COMMUNITY)
Admission: RE | Admit: 2023-01-14 | Discharge: 2023-01-14 | Disposition: A | Discharge status: Home / Self Care | Payer: Medicare Other | Attending: Interventional Radiology | Admitting: Interventional Radiology

## 2023-01-14 DIAGNOSIS — F17210 Nicotine dependence, cigarettes, uncomplicated: Secondary | ICD-10-CM

## 2023-01-14 DIAGNOSIS — N281 Cyst of kidney, acquired: Secondary | ICD-10-CM

## 2023-01-14 DIAGNOSIS — I10 Essential (primary) hypertension: Secondary | ICD-10-CM | POA: Diagnosis not present

## 2023-01-14 DIAGNOSIS — N2889 Other specified disorders of kidney and ureter: Secondary | ICD-10-CM | POA: Diagnosis not present

## 2023-01-14 HISTORY — DX: Other psychoactive substance abuse, uncomplicated: F19.10

## 2023-01-14 HISTORY — PX: RADIOLOGY WITH ANESTHESIA: SHX6223

## 2023-01-14 HISTORY — DX: Prediabetes: R73.03

## 2023-01-14 LAB — CBC WITH DIFFERENTIAL/PLATELET
Abs Immature Granulocytes: 0.04 10*3/uL (ref 0.00–0.07)
Basophils Absolute: 0 10*3/uL (ref 0.0–0.1)
Basophils Relative: 0 %
Eosinophils Absolute: 0 10*3/uL (ref 0.0–0.5)
Eosinophils Relative: 0 %
HCT: 43.2 % (ref 39.0–52.0)
Hemoglobin: 15 g/dL (ref 13.0–17.0)
Immature Granulocytes: 0 %
Lymphocytes Relative: 14 %
Lymphs Abs: 1.7 10*3/uL (ref 0.7–4.0)
MCH: 29.1 pg (ref 26.0–34.0)
MCHC: 34.7 g/dL (ref 30.0–36.0)
MCV: 83.9 fL (ref 80.0–100.0)
Monocytes Absolute: 0.1 10*3/uL (ref 0.1–1.0)
Monocytes Relative: 1 %
Neutro Abs: 10.1 10*3/uL — ABNORMAL HIGH (ref 1.7–7.7)
Neutrophils Relative %: 85 %
Platelets: 316 10*3/uL (ref 150–400)
RBC: 5.15 MIL/uL (ref 4.22–5.81)
RDW: 11.9 % (ref 11.5–15.5)
WBC: 11.9 10*3/uL — ABNORMAL HIGH (ref 4.0–10.5)
nRBC: 0 % (ref 0.0–0.2)

## 2023-01-14 LAB — BASIC METABOLIC PANEL
Anion gap: 11 (ref 5–15)
BUN: 18 mg/dL (ref 6–20)
CO2: 25 mmol/L (ref 22–32)
Calcium: 9.6 mg/dL (ref 8.9–10.3)
Chloride: 101 mmol/L (ref 98–111)
Creatinine, Ser: 0.87 mg/dL (ref 0.61–1.24)
GFR, Estimated: >60 mL/min (ref >60)
Glucose, Bld: 165 mg/dL — ABNORMAL HIGH (ref 70–99)
Potassium: 4.1 mmol/L (ref 3.5–5.1)
Sodium: 137 mmol/L (ref 135–145)

## 2023-01-14 LAB — PROTIME-INR
INR: 1 (ref 0.8–1.2)
Prothrombin Time: 13.2 seconds (ref 11.4–15.2)

## 2023-01-14 SURGERY — CT WITH ANESTHESIA
Anesthesia: General | Laterality: Right

## 2023-01-14 MED ORDER — SUGAMMADEX SODIUM 200 MG/2ML IV SOLN
INTRAVENOUS | Status: DC | PRN
  Administered 2023-01-14: 400 mg via INTRAVENOUS

## 2023-01-14 MED ORDER — HYDROMORPHONE HCL 1 MG/ML IJ SOLN
INTRAMUSCULAR | Status: AC
  Filled 2023-01-14: qty 1

## 2023-01-14 MED ORDER — SODIUM CHLORIDE 0.9 % IV SOLN: INTRAVENOUS | Status: DC

## 2023-01-14 MED ORDER — DIPHENHYDRAMINE HCL 50 MG/ML IJ SOLN
INTRAMUSCULAR | Status: AC
  Filled 2023-01-14: qty 1

## 2023-01-14 MED ORDER — DOCUSATE SODIUM 100 MG PO CAPS
100.0000 mg | ORAL_CAPSULE | Freq: Two times a day (BID) | ORAL | Status: DC
Start: 2023-01-14 — End: 2023-01-14

## 2023-01-14 MED ORDER — FENTANYL CITRATE (PF) 250 MCG/5ML IJ SOLN
INTRAMUSCULAR | Status: DC | PRN
  Administered 2023-01-14: 100 ug via INTRAVENOUS

## 2023-01-14 MED ORDER — PROPOFOL 10 MG/ML IV BOLUS
INTRAVENOUS | Status: DC | PRN
  Administered 2023-01-14: 150 mg via INTRAVENOUS

## 2023-01-14 MED ORDER — HYDROCODONE-ACETAMINOPHEN 5-325 MG PO TABS
1.0000 | ORAL_TABLET | ORAL | Status: DC | PRN
Start: 2023-01-14 — End: 2023-01-14

## 2023-01-14 MED ORDER — ONDANSETRON HCL 4 MG/2ML IJ SOLN
INTRAMUSCULAR | Status: DC | PRN
  Administered 2023-01-14: 4 mg via INTRAVENOUS

## 2023-01-14 MED ORDER — CHLORHEXIDINE GLUCONATE 0.12 % MT SOLN
15.0000 mL | Freq: Once | OROMUCOSAL | Status: AC
  Administered 2023-01-14: 15 mL via OROMUCOSAL
  Filled 2023-01-14: qty 15

## 2023-01-14 MED ORDER — HYDROMORPHONE HCL 1 MG/ML IJ SOLN
0.2500 mg | INTRAMUSCULAR | Status: DC | PRN
  Administered 2023-01-14 (×2): 0.5 mg via INTRAVENOUS

## 2023-01-14 MED ORDER — HYDROMORPHONE HCL 1 MG/ML IJ SOLN
0.2500 mg | INTRAMUSCULAR | Status: DC | PRN
  Administered 2023-01-14 (×4): 0.5 mg via INTRAVENOUS

## 2023-01-14 MED ORDER — DIPHENHYDRAMINE HCL 50 MG/ML IJ SOLN
12.5000 mg | Freq: Once | INTRAMUSCULAR | Status: AC
  Administered 2023-01-14: 12.5 mg via INTRAVENOUS

## 2023-01-14 MED ORDER — ROCURONIUM BROMIDE 10 MG/ML (PF) SYRINGE
PREFILLED_SYRINGE | INTRAVENOUS | Status: DC | PRN
  Administered 2023-01-14: 10 mg via INTRAVENOUS
  Administered 2023-01-14: 100 mg via INTRAVENOUS

## 2023-01-14 MED ORDER — DIPHENHYDRAMINE HCL 50 MG/ML IJ SOLN
37.5000 mg | Freq: Once | INTRAMUSCULAR | Status: AC
  Administered 2023-01-14: 37.5 mg via INTRAVENOUS

## 2023-01-14 MED ORDER — OXYCODONE HCL 5 MG PO TABS: 5.0000 mg | ORAL_TABLET | Freq: Once | ORAL | Status: DC | PRN

## 2023-01-14 MED ORDER — DEXAMETHASONE SODIUM PHOSPHATE 10 MG/ML IJ SOLN
INTRAMUSCULAR | Status: DC | PRN
  Administered 2023-01-14: 10 mg via INTRAVENOUS

## 2023-01-14 MED ORDER — ACETAMINOPHEN 10 MG/ML IV SOLN
1000.0000 mg | Freq: Once | INTRAVENOUS | Status: AC
  Administered 2023-01-14: 1000 mg via INTRAVENOUS

## 2023-01-14 MED ORDER — LIDOCAINE 2% (20 MG/ML) 5 ML SYRINGE
INTRAMUSCULAR | Status: DC | PRN
  Administered 2023-01-14: 100 mg via INTRAVENOUS

## 2023-01-14 MED ORDER — PROMETHAZINE HCL 25 MG/ML IJ SOLN: 6.2500 mg | INTRAMUSCULAR | Status: DC | PRN

## 2023-01-14 MED ORDER — BUPRENORPHINE HCL 8 MG SL SUBL
8.0000 mg | SUBLINGUAL_TABLET | Freq: Once | SUBLINGUAL | Status: AC
  Administered 2023-01-14: 8 mg via SUBLINGUAL
  Filled 2023-01-14: qty 1

## 2023-01-14 MED ORDER — ONDANSETRON HCL 4 MG/2ML IJ SOLN: 4.0000 mg | Freq: Four times a day (QID) | INTRAMUSCULAR | Status: DC | PRN

## 2023-01-14 MED ORDER — AMISULPRIDE (ANTIEMETIC) 5 MG/2ML IV SOLN: 10.0000 mg | Freq: Once | INTRAVENOUS | Status: DC | PRN

## 2023-01-14 MED ORDER — OXYCODONE HCL 5 MG/5ML PO SOLN: 5.0000 mg | Freq: Once | ORAL | Status: DC | PRN

## 2023-01-14 MED ORDER — ORAL CARE MOUTH RINSE: 15.0000 mL | Freq: Once | OROMUCOSAL | Status: AC

## 2023-01-14 MED ORDER — ACETAMINOPHEN 10 MG/ML IV SOLN
INTRAVENOUS | Status: AC
  Filled 2023-01-14: qty 100

## 2023-01-14 NOTE — Anesthesia Postprocedure Evaluation (Signed)
Anesthesia Post Note  Patient: Wyatt Murray  Procedure(s) Performed: Right renal cryoablation (Right)     Patient location during evaluation: PACU Anesthesia Type: General Level of consciousness: awake and alert Pain management: pain level controlled Vital Signs Assessment: post-procedure vital signs reviewed and stable Respiratory status: spontaneous breathing, nonlabored ventilation and respiratory function stable Cardiovascular status: blood pressure returned to baseline and stable Postop Assessment: no apparent nausea or vomiting Anesthetic complications: no   No notable events documented.  Last Vitals:  Vitals:   01/14/23 1215 01/14/23 1230  BP: 122/88 (!) 134/94  Pulse: 89 (!) 104  Resp: (!) 9 17  Temp:    SpO2: 94% 95%    Last Pain:  Vitals:   01/14/23 1200  TempSrc:   PainSc: 8                  Lowella Curb

## 2023-01-14 NOTE — Progress Notes (Signed)
Patient seen at bedside post procedure in PACU # 17  with IR Attending Dr. DD Deanne Coffer. Patient is reporting generalized abdominal pain and itching. Patient was prescribed 12.5 mg of benadryl by Anesthesia. Dose was changed to 50 mg and requested RN at bedside order the patient's home medication of buprennmorphine 5 mg sublingual be ordered from pharmacy. Patient is agreeable to the plan and states he will stay until 14:00.

## 2023-01-14 NOTE — Progress Notes (Addendum)
Please review updated discharge information on mychart.

## 2023-01-14 NOTE — Sedation Documentation (Signed)
Refer to anesthesia documentation for remainder of procedure 

## 2023-01-14 NOTE — Anesthesia Procedure Notes (Signed)
Procedure Name: Intubation Date/Time: 01/14/2023 8:47 AM  Performed by: Cheree Ditto, CRNAPre-anesthesia Checklist: Patient identified, Emergency Drugs available, Suction available and Patient being monitored Patient Re-evaluated:Patient Re-evaluated prior to induction Oxygen Delivery Method: Circle system utilized Preoxygenation: Pre-oxygenation with 100% oxygen Induction Type: IV induction Ventilation: Mask ventilation without difficulty Laryngoscope Size: Mac and 4 Grade View: Grade I Tube type: Oral Number of attempts: 1 Airway Equipment and Method: Stylet and Oral airway Placement Confirmation: ETT inserted through vocal cords under direct vision, positive ETCO2 and breath sounds checked- equal and bilateral Secured at: 23 cm Tube secured with: Tape Dental Injury: Teeth and Oropharynx as per pre-operative assessment

## 2023-01-14 NOTE — Progress Notes (Signed)
Patient urgent to go home, explained that AVS paperwork was being processed by care team. Patient stated that he would follow up and log on to mychart to view updated AVS. AVS printed out as is, general anesthesia aftercare instructions added and reviewed with patient and fiance, Dahlia Client. Patient verbalized that he will review AVS paperwork on my chart when he gets home. Care team aware.

## 2023-01-14 NOTE — Progress Notes (Signed)
Patient complaints of his chronic pain to his abdomen. Medication administration per orders given. Additional  PRN orders received by Dr Hyacinth Meeker, anaesthesiologist. Patient has expressed the desire to go home stating he could go home and be miserable than stay here in the hospital. Multiple calls to attending to discuss discharge.

## 2023-01-14 NOTE — Anesthesia Preprocedure Evaluation (Addendum)
Anesthesia Evaluation  Patient identified by MRN, date of birth, ID band Patient awake    Reviewed: Allergy & Precautions, H&P , NPO status , Patient's Chart, lab work & pertinent test results  Airway Mallampati: II  TM Distance: >3 FB Neck ROM: Full    Dental  (+) Edentulous Upper, Poor Dentition, Chipped, Missing   Pulmonary Current SmokerPatient did not abstain from smoking., former smoker   Pulmonary exam normal breath sounds clear to auscultation       Cardiovascular hypertension, Pt. on medications Normal cardiovascular exam Rhythm:Regular Rate:Normal     Neuro/Psych negative neurological ROS  negative psych ROS   GI/Hepatic Neg liver ROS,GERD  ,,  Endo/Other  negative endocrine ROS    Renal/GU Renal disease  negative genitourinary   Musculoskeletal negative musculoskeletal ROS (+)    Abdominal   Peds negative pediatric ROS (+)  Hematology negative hematology ROS (+)   Anesthesia Other Findings Renal Mass  Reproductive/Obstetrics negative OB ROS                             Anesthesia Physical Anesthesia Plan  ASA: 3  Anesthesia Plan: General   Post-op Pain Management: Dilaudid IV   Induction: Intravenous  PONV Risk Score and Plan: 1 and Ondansetron and Treatment may vary due to age or medical condition  Airway Management Planned: Oral ETT  Additional Equipment:   Intra-op Plan:   Post-operative Plan: Extubation in OR  Informed Consent: I have reviewed the patients History and Physical, chart, labs and discussed the procedure including the risks, benefits and alternatives for the proposed anesthesia with the patient or authorized representative who has indicated his/her understanding and acceptance.     Dental advisory given  Plan Discussed with: CRNA  Anesthesia Plan Comments:        Anesthesia Quick Evaluation

## 2023-01-14 NOTE — Procedures (Signed)
  Procedure:  CT guided cryoablation RUP renal mass   Preprocedure diagnosis: The encounter diagnosis was Right renal mass. Postprocedure diagnosis: same EBL:    minimal Complications:   none immediate  See full dictation in [employer].  Thora Lance MD Main # [phone] Pager  [phone] Mobile [phone]

## 2023-01-14 NOTE — Transfer of Care (Signed)
Immediate Anesthesia Transfer of Care Note  Patient: Wyatt Murray  Procedure(s) Performed: Right renal cryoablation (Right)  Patient Location: PACU  Anesthesia Type:General  Level of Consciousness: awake, alert , and oriented  Airway & Oxygen Therapy: Patient Spontanous Breathing  Post-op Assessment: Report given to RN and Post -op Vital signs reviewed and stable  Post vital signs: Reviewed and stable  Last Vitals:  Vitals Value Taken Time  BP 152/98 01/14/23 1100  Temp    Pulse 95 01/14/23 1109  Resp 15 01/14/23 1109  SpO2 98 % 01/14/23 1109  Vitals shown include unvalidated device data.  Last Pain:  Vitals:   01/14/23 0700  TempSrc:   PainSc: 4          Complications: No notable events documented.

## 2023-01-17 ENCOUNTER — Encounter (HOSPITAL_COMMUNITY): Payer: Self-pay | Admitting: Interventional Radiology

## 2023-02-16 ENCOUNTER — Ambulatory Visit
Admission: RE | Admit: 2023-02-16 | Discharge: 2023-02-16 | Disposition: A | Discharge status: Home / Self Care | Payer: Medicare Other | Source: Ambulatory Visit | Attending: Radiology | Admitting: Radiology

## 2023-02-16 DIAGNOSIS — N281 Cyst of kidney, acquired: Secondary | ICD-10-CM

## 2023-02-16 NOTE — Progress Notes (Signed)
Patient ID: Wyatt Murray, male   DOB: 12-07-1975, 47 y.o.   MRN: [id number]       Chief Complaint: Patient was consulted remotely today (TeleHealth) for renal mass post cryoablation  at the request of Omohundro,Jennifer C.    Referring Physician(s): Matther Gay  History of Present Illness: Wyatt Murray is a 47 y.o. male  who 04/27/2022 CT for left-sided abdominal pain secondary to hydrocele showed new partially exophytic lesion from the upper pole right kidney was identified, new since prior CT of 10/14/2013.  The lesion could not be identified on subsequent ultrasound.  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  No evidence of renal vein involvement or regional metastatic disease.  His kidney function   normal.   contrast allergy which complicated getting the contrast-enhanced CT scans.    claustrophobic, relatively adverse to concept of   MRI.   No flank pain,  Hematuria, or dysuria.  Left hydrocele was noted on previous imaging.   multiple abdominal surgeries with remote history of incarcerated hernia. 01/14/23 uncomplicated CT guided cryoablation of lesion, went home same day He did have some gross hematuria with clots at home for couple of days.  This  resolved completely.  No flank pain.  He is back to full activity.   Past Medical History:  Diagnosis Date   Blood in stool    GERD (gastroesophageal reflux disease)    Hyperlipidemia    Hypertension    Pre-diabetes    Substance abuse (HCC)    pain meds    Past Surgical History:  Procedure Laterality Date   APPENDECTOMY     BOWEL RESECTION     HERNIA REPAIR     Inguinal x 10 surgeries with strangulated bowel resected   RADIOLOGY WITH ANESTHESIA Right 01/14/2023   Procedure: Right renal cryoablation;  Surgeon: Oley Balm,  MD;  Location: [facility] OR;  Service: Radiology;  Laterality: Right;    Allergies: Iodinated contrast media, Iodine, Midazolam, Shellfish allergy, and Atorvastatin  Medications: Prior to Admission medications   Medication Sig Start Date End Date Taking? Authorizing Provider  atorvastatin (LIPITOR) 20 MG tablet TAKE 1 TABLET(20 MG) BY MOUTH DAILY 12/07/22   Etta Grandchild, MD  buprenorphine (SUBUTEX) 8 MG SUBL SL tablet Place 8 mg under the tongue 2 (two) times daily.    [provider]  dicyclomine (BENTYL) 10 MG capsule Take 1 capsule (10 mg total) by mouth 3 (three) times daily before meals. Patient taking differently: Take 10 mg by mouth 3 (three) times daily as needed (Stomach pain). 06/08/22   Etta Grandchild, MD  EPINEPHrine (EPIPEN 2-PAK) 0.3 mg/0.3 mL IJ SOAJ injection Inject 0.3 mg into the muscle as needed for anaphylaxis. 01/10/23   Etta Grandchild, MD  ergocalciferol (VITAMIN D2) 1.25 MG (50000 UT) capsule Take 50,000 Units by mouth once a week.    [provider]  lidocaine (LIDODERM) 5 % Place 1 patch onto the skin daily as needed (pain). Remove & Discard patch within 12 hours or as directed by MD    [provider]  Metoprolol Succinate 50 MG CS24 Take 1 tablet by mouth daily. 08/27/22   Etta Grandchild, MD  Omega 3 1000 MG CAPS Take 1,000 mg by mouth daily.    [provider]  potassium chloride SA (KLOR-CON  M15) 15 MEQ tablet Take 1 tablet (15 mEq total) by mouth 2 (two) times daily. 10/05/22   Etta Grandchild, MD  predniSONE (DELTASONE) 50 MG tablet Take one tablet at 13 hours, 7 hours and 1 hour prior to your procedure. 01/10/23   Mickie Kay, NP  torsemide (DEMADEX) 10 MG tablet Take 1 tablet (10 mg total) by mouth daily. 10/05/22   Etta Grandchild, MD     Family History  Problem Relation Age of Onset   Stroke Mother    Hypertension Mother    Hyperlipidemia Mother    Heart disease Mother        CABG   Diabetes Mother    Arthritis Mother     Cancer Father        throat   Arthritis Father    Alcohol abuse Father    Hypertension Paternal Grandfather    Hyperlipidemia Paternal Grandfather    Cancer Paternal Grandfather        pituatary, throat   Alcohol abuse Paternal Grandfather     Social History   Socioeconomic History   Marital status: Divorced    Spouse name: Not on file   Number of children: Not on file   Years of education: Not on file   Highest education level: Not on file  Occupational History   Not on file  Tobacco Use   Smoking status: Former    Types: Cigarettes    Quit date: 11/02/2021    Years since quitting: 1.2    Passive exposure: Current   Smokeless tobacco: Never  Vaping Use   Vaping Use: Every day   Substances: Nicotine  Substance and Sexual Activity   Alcohol use: Not Currently    Comment: 01/12/23   Drug use: Not Currently   Sexual activity: Yes    Partners: Female  Other Topics Concern   Not on file  Social History Narrative   Lives with fiance.  One child age 68.     Social Determinants of Health   Financial Resource Strain: Not on file  Food Insecurity: Not on file  Transportation Needs: Not on file  Physical Activity: Not on file  Stress: Not on file  Social Connections: Not on file    ECOG Status: 0 - Asymptomatic  Review of Systems  Review of Systems: A 12 point ROS discussed and pertinent positives are indicated in the HPI above.  All other systems are negative.    Physical Exam No direct physical exam was performed (except for noted visual exam findings with Video Visits).     Vital Signs: There were no vitals taken for this visit.  Imaging: No results found.  Labs:  CBC: Recent Labs    04/27/22 1032 06/07/22 1038 01/11/23 0610 01/14/23 0704  WBC 13.9* 10.2 9.7 11.9*  HGB 16.4 13.7 14.0 15.0  HCT 49.0 40.1 42.4 43.2  PLT 336 248.0 278 316    COAGS: Recent Labs    01/14/23 0726  INR 1.0    BMP: Recent Labs    04/27/22 1032  06/07/22 1038 10/04/22 1158 01/11/23 0610 01/14/23 0704  NA 137 136 139 140 137  K 3.3* 3.5 4.1 3.9 4.1  CL 98 98 101 104 101  CO2 25 31 31 25 25   GLUCOSE 113* 96 113* 124* 165*  BUN 14 13 11 11 18   CALCIUM 9.8 9.3 9.3 9.3 9.6  CREATININE 0.97 0.86 0.86 0.87 0.87  GFRNONAA >60  --   --  >60 >  60    LIVER FUNCTION TESTS: Recent Labs    04/27/22 1032 01/11/23 0610  BILITOT 1.0 1.0  AST 36 44*  ALT 31 32  ALKPHOS 81 74  PROT 8.1 6.9  ALBUMIN 4.3 3.7    TUMOR MARKERS: No results for input(s): "AFPTM", "CEA", "CA199", "CHROMGRNA" in the last 8760 hours.  Assessment and Plan:  My impression is that this patient has done well post cryoablation of his right renal lesion presumed carcinoma.  We discussed that the postprocedural gross hematuria   is relatively rare but can be associated with irritation of the collecting system at the time of treatment and is self-limited, with no expectation of any recurrence.  Will plan to get a follow-up baseline CT renal with and without contrast at about the 3-40-month post procedure mark, and then routine surveillance over lengthening intervals until a total of 5 years surveillance when we can call this complete cure.  He seemed to understand and did ask the appropriate questions.  Will get a first postop scan in July/August and I will follow-up with him at that time.  He knows to call in the interval with any questions or problems or recurrence of hematuria.   Thank you for this interesting consult.  I greatly enjoyed meeting Wyatt Murray and look forward to participating in their care.  A copy of this report was sent to the requesting provider on this date.  Electronically Signed: Durwin Glaze 02/16/2023, 9:49 AM   I spent a total of    15 Minutes in remote  clinical consultation, greater than 50% of which was counseling/coordinating care for Right renal mass, post cryoablation.    Visit type: Audio only (telephone). Audio (no  video) only due to patient's lack of internet/smartphone capability. Alternative for in-person consultation at [facility], [address] [place], [place], [place].  This format is felt to be most appropriate for this patient at this time.  All issues noted in this document were discussed and addressed.

## 2023-02-24 ENCOUNTER — Other Ambulatory Visit: Payer: Self-pay | Admitting: Internal Medicine

## 2023-02-24 ENCOUNTER — Encounter: Payer: Self-pay | Admitting: Internal Medicine

## 2023-02-24 DIAGNOSIS — I10 Essential (primary) hypertension: Secondary | ICD-10-CM

## 2023-02-24 MED ORDER — METOPROLOL SUCCINATE 50 MG PO CS24: 1.0000 | EXTENDED_RELEASE_CAPSULE | Freq: Every day | ORAL | 0 refills | Status: DC

## 2023-02-25 ENCOUNTER — Other Ambulatory Visit: Payer: Self-pay | Admitting: Internal Medicine

## 2023-02-25 ENCOUNTER — Encounter: Payer: Self-pay | Admitting: Internal Medicine

## 2023-03-01 ENCOUNTER — Other Ambulatory Visit: Payer: Self-pay | Admitting: Internal Medicine

## 2023-03-01 ENCOUNTER — Telehealth: Payer: Self-pay | Admitting: Internal Medicine

## 2023-03-01 DIAGNOSIS — I10 Essential (primary) hypertension: Secondary | ICD-10-CM

## 2023-03-01 MED ORDER — METOPROLOL SUCCINATE ER 50 MG PO TB24
50.0000 mg | ORAL_TABLET | Freq: Every day | ORAL | 0 refills | Status: AC
Start: 2023-03-01 — End: ?

## 2023-03-01 NOTE — Telephone Encounter (Signed)
Pharmacy called insurance will not cover capsule for medication just the tablets. Metoprolol Succinate 50 MG CS24

## 2023-03-09 ENCOUNTER — Ambulatory Visit: Payer: Medicare Other | Admitting: Internal Medicine

## 2023-04-26 ENCOUNTER — Other Ambulatory Visit: Payer: Self-pay | Admitting: Interventional Radiology

## 2023-04-26 DIAGNOSIS — N281 Cyst of kidney, acquired: Secondary | ICD-10-CM

## 2023-04-27 ENCOUNTER — Other Ambulatory Visit: Payer: Self-pay | Admitting: Interventional Radiology

## 2023-04-27 DIAGNOSIS — N281 Cyst of kidney, acquired: Secondary | ICD-10-CM

## 2023-05-09 ENCOUNTER — Ambulatory Visit
Admission: RE | Admit: 2023-05-09 | Discharge: 2023-05-09 | Disposition: A | Discharge status: Home / Self Care | Payer: Medicare Other | Source: Ambulatory Visit | Attending: Interventional Radiology | Admitting: Interventional Radiology

## 2023-05-09 DIAGNOSIS — N281 Cyst of kidney, acquired: Secondary | ICD-10-CM

## 2023-05-13 ENCOUNTER — Ambulatory Visit
Admission: RE | Admit: 2023-05-13 | Discharge: 2023-05-13 | Disposition: A | Discharge status: Home / Self Care | Payer: Medicare Other | Source: Ambulatory Visit | Attending: Interventional Radiology | Admitting: Interventional Radiology

## 2023-05-13 DIAGNOSIS — N281 Cyst of kidney, acquired: Secondary | ICD-10-CM

## 2023-05-13 NOTE — Progress Notes (Signed)
Patient ID: Wyatt Murray, male   DOB: 14-Jun-1976, 47 y.o.   MRN: [id number]       Chief Complaint: Patient was consulted remotely today (TeleHealth) for Renal neoplasm, post cryoablation at the request of Gaspard Isbell.    Referring Physician(s): Dr. Jettie Pagan   History of Present Illness: Wyatt Murray is a 47 y.o. male  Outpatient. History of HTN, HLD, GERD, IBS.  04/27/2022 Found to have a lesion in the upper pole of his right kidney while being worked up for hydrocele.  4/16/2024Most recent imaging obtained when Mr. Monagle presented to the Emergency Department at [facility] for abdominal pain o  CT Abd and pelvis f reads stable. 14 mm involving the upper pole of the right kidney. This showed enhancement on prior CT and is suspicious for small renal cell neoplasm.  11/08/2022 Patient was seen in the Interventional Radiology clinic  4/19/2024Patient underwent technically successful CT-guided cryoablation of the lesion.  He was discharged home in good condition.    Patient states that he had some intermittent hematuria in the days post procedure but this is cleared entirely.  No pain at the treatment site.  He got a follow-up CT 05/09/2023  Past Medical History:  Diagnosis Date   Blood in stool    GERD (gastroesophageal reflux disease)    Hyperlipidemia    Hypertension    Pre-diabetes    Substance abuse (HCC)    pain meds    Past Surgical History:  Procedure Laterality Date   APPENDECTOMY     BOWEL RESECTION     HERNIA REPAIR     Inguinal x 10 surgeries with strangulated bowel resected   RADIOLOGY WITH ANESTHESIA Right 01/14/2023   Procedure: Right renal cryoablation;  Surgeon: Oley Balm, MD;  Location: [facility] OR;  Service: Radiology;  Laterality: Right;    Allergies: Iodinated contrast media, Iodine, Midazolam, Shellfish allergy, and Atorvastatin  Medications: Prior to Admission medications   Medication Sig Start Date End Date Taking? Authorizing Provider   metoprolol succinate (TOPROL-XL) 50 MG 24 hr tablet Take 1 tablet (50 mg total) by mouth daily. Take with or immediately following a meal. 03/01/23   Etta Grandchild, MD  atorvastatin (LIPITOR) 20 MG tablet TAKE 1 TABLET(20 MG) BY MOUTH DAILY 12/07/22   Etta Grandchild, MD  buprenorphine (SUBUTEX) 8 MG SUBL SL tablet Place 8 mg under the tongue 2 (two) times daily.    [provider]  dicyclomine (BENTYL) 10 MG capsule Take 1 capsule (10 mg total) by mouth 3 (three) times daily before meals. Patient taking differently: Take 10 mg by mouth 3 (three) times daily as needed (Stomach pain). 06/08/22   Etta Grandchild, MD  EPINEPHrine (EPIPEN 2-PAK) 0.3 mg/0.3 mL IJ SOAJ injection Inject 0.3 mg into the muscle as needed for anaphylaxis. 01/10/23   Etta Grandchild, MD  ergocalciferol (VITAMIN D2) 1.25 MG (50000 UT) capsule Take 50,000 Units by mouth once a week.    [provider]  lidocaine (LIDODERM) 5 % Place 1 patch onto the skin daily as needed (pain). Remove & Discard patch within 12 hours or as directed by MD    [provider]  Omega 3 1000 MG CAPS Take 1,000 mg by mouth daily.    [provider]  potassium chloride SA (KLOR-CON M15) 15 MEQ tablet Take 1 tablet (15 mEq total) by mouth 2 (two) times daily. 10/05/22   Etta Grandchild, MD  predniSONE (DELTASONE) 50 MG tablet Take one  tablet at 13 hours, 7 hours and 1 hour prior to your procedure. 01/10/23   Mickie Kay, NP  torsemide (DEMADEX) 10 MG tablet Take 1 tablet (10 mg total) by mouth daily. 10/05/22   Etta Grandchild, MD     Family History  Problem Relation Age of Onset   Stroke Mother    Hypertension Mother    Hyperlipidemia Mother    Heart disease Mother        CABG   Diabetes Mother    Arthritis Mother    Cancer Father        throat   Arthritis Father    Alcohol abuse Father    Hypertension Paternal Grandfather    Hyperlipidemia Paternal Grandfather    Cancer Paternal Grandfather         pituatary, throat   Alcohol abuse Paternal Grandfather     Social History   Socioeconomic History   Marital status: Divorced    Spouse name: Not on file   Number of children: Not on file   Years of education: Not on file   Highest education level: Not on file  Occupational History   Not on file  Tobacco Use   Smoking status: Former    Current packs/day: 0.00    Types: Cigarettes    Quit date: 11/02/2021    Years since quitting: 1.5    Passive exposure: Current   Smokeless tobacco: Never  Vaping Use   Vaping status: Every Day   Substances: Nicotine  Substance and Sexual Activity   Alcohol use: Not Currently    Comment: 01/12/23   Drug use: Not Currently   Sexual activity: Yes    Partners: Female  Other Topics Concern   Not on file  Social History Narrative   Lives with fiance.  One child age 68.     Social Determinants of Health   Financial Resource Strain: Low Risk  (10/06/2018)   Received from [facility], [facility]   Overall Financial Resource Strain (CARDIA)    Difficulty of Paying Living Expenses: Not hard at all  Food Insecurity: No Food Insecurity (10/06/2018)   Received from [facility], [facility]   Hunger Vital Sign    Worried About Running Out of Food in the Last Year: Never true    Ran Out of Food in the Last Year: Never true  Transportation Needs: No Transportation Needs (10/06/2018)   Received from [facility], [facility]   [facility] - Transportation    Lack of Transportation (Medical): No    Lack of Transportation (Non-Medical): No  Physical Activity: Inactive (10/06/2018)   Received from [facility], [facility]   Exercise Vital Sign    Days of Exercise per Week: 0 days    Minutes of Exercise per Session: 0 min  Stress: No Stress Concern Present (10/06/2018)   Received from [facility], [facility] - Occupational Stress Questionnaire    Feeling of Stress : Not at all   Social Connections: Unknown (10/06/2018)   Received from [facility], [facility]   Social Connection and Isolation Panel [NHANES]    Frequency of Communication with Friends and Family: Three times a week    Frequency of Social Gatherings with Friends and Family: Twice a week    Attends Religious Services: Patient declined    Active Member of Clubs or Organizations: No    Attends  Banker Meetings: Not asked    Marital Status: Separated    ECOG Status: 0 - Asymptomatic  Review of Systems  Review of Systems: A 12 point ROS discussed and pertinent positives are indicated in the HPI above.  All other systems are negative.    Physical Exam No direct physical exam was performed (except for noted visual exam findings with Video Visits).    Vital Signs: There were no vitals taken for this visit.  Imaging: No results found.  Labs:  CBC: Recent Labs    06/07/22 1038 01/11/23 0610 01/14/23 0704  WBC 10.2 9.7 11.9*  HGB 13.7 14.0 15.0  HCT 40.1 42.4 43.2  PLT 248.0 278 316    COAGS: Recent Labs    01/14/23 0726  INR 1.0    BMP: Recent Labs    06/07/22 1038 10/04/22 1158 01/11/23 0610 01/14/23 0704  NA 136 139 140 137  K 3.5 4.1 3.9 4.1  CL 98 101 104 101  CO2 31 31 25 25   GLUCOSE 96 113* 124* 165*  BUN 13 11 11 18   CALCIUM 9.3 9.3 9.3 9.6  CREATININE 0.86 0.86 0.87 0.87  GFRNONAA  --   --  >60 >60    LIVER FUNCTION TESTS: Recent Labs    01/11/23 0610  BILITOT 1.0  AST 44*  ALT 32  ALKPHOS 74  PROT 6.9  ALBUMIN 3.7    TUMOR MARKERS: No results for input(s): "AFPTM", "CEA", "CA199", "CHROMGRNA" in the last 8760 hours.  Assessment and Plan: My  Impression is that this patient has done well  post CT guided cryoablation of right upper pole  renal lesion.  The noncontrast follow-up CT shows expected ablation changes at the level of the lesion and regional retroperitoneum.  No evidence of early  complication.  I did discuss with  the patient that the use of IV contrast is recommended for follow-up studies to better assess any residual viable or recurrent tumor.  He did understand and is agreeable to the required premedication given his previous allergic reaction.  He is very claustrophobic so MRI is out of the question.  Accordingly, we will plan to get a follow-up CT in about 6 months without /with contrast to assess for resolution of the post ablation changes, rule out residual/recurrent disease.  He knows to call in the interval with any questions or problems.   Thank you for this interesting consult.  I greatly enjoyed meeting DAEMOND CLEARWATER and look forward to participating in their care.  A copy of this report was sent to the requesting provider on this date.  Electronically Signed: Durwin Glaze 05/13/2023, 1:53 PM   I spent a total of    25 Minutes in remote  clinical consultation, greater than 50% of which was counseling/coordinating care for Renal neoplasm, post cryoablation.    Visit type: Audio only (telephone). Audio (no video) only due to patient's lack of internet/smartphone capability. Alternative for in-person consultation at [facility], [address] [place], [place], [place].  This format is felt to be most appropriate for this patient at this time.  All issues noted in this document were discussed and addressed.

## 2024-01-31 ENCOUNTER — Other Ambulatory Visit: Payer: Self-pay | Admitting: Internal Medicine

## 2024-01-31 DIAGNOSIS — I10 Essential (primary) hypertension: Secondary | ICD-10-CM

## 2024-01-31 DIAGNOSIS — E876 Hypokalemia: Secondary | ICD-10-CM

## 2024-06-08 ENCOUNTER — Inpatient Hospital Stay
Admission: EM | Admit: 2024-06-08 | Discharge: 2024-06-11 | DRG: 871 | Disposition: A | Discharge status: Home / Self Care | Attending: Internal Medicine | Admitting: Internal Medicine

## 2024-06-08 ENCOUNTER — Emergency Department

## 2024-06-08 ENCOUNTER — Other Ambulatory Visit: Payer: Self-pay

## 2024-06-08 ENCOUNTER — Encounter: Payer: Self-pay | Admitting: Intensive Care

## 2024-06-08 DIAGNOSIS — Z833 Family history of diabetes mellitus: Secondary | ICD-10-CM | POA: Diagnosis not present

## 2024-06-08 DIAGNOSIS — Z91041 Radiographic dye allergy status: Secondary | ICD-10-CM | POA: Diagnosis not present

## 2024-06-08 DIAGNOSIS — Z723 Lack of physical exercise: Secondary | ICD-10-CM

## 2024-06-08 DIAGNOSIS — R6520 Severe sepsis without septic shock: Secondary | ICD-10-CM | POA: Diagnosis not present

## 2024-06-08 DIAGNOSIS — F17290 Nicotine dependence, other tobacco product, uncomplicated: Secondary | ICD-10-CM | POA: Diagnosis present

## 2024-06-08 DIAGNOSIS — A401 Sepsis due to streptococcus, group B: Secondary | ICD-10-CM | POA: Diagnosis present

## 2024-06-08 DIAGNOSIS — Z6835 Body mass index (BMI) 35.0-35.9, adult: Secondary | ICD-10-CM

## 2024-06-08 DIAGNOSIS — I890 Lymphedema, not elsewhere classified: Secondary | ICD-10-CM | POA: Diagnosis present

## 2024-06-08 DIAGNOSIS — I10 Essential (primary) hypertension: Secondary | ICD-10-CM | POA: Diagnosis present

## 2024-06-08 DIAGNOSIS — R7881 Bacteremia: Secondary | ICD-10-CM | POA: Diagnosis not present

## 2024-06-08 DIAGNOSIS — Z888 Allergy status to other drugs, medicaments and biological substances status: Secondary | ICD-10-CM | POA: Diagnosis not present

## 2024-06-08 DIAGNOSIS — A419 Sepsis, unspecified organism: Secondary | ICD-10-CM | POA: Diagnosis not present

## 2024-06-08 DIAGNOSIS — E876 Hypokalemia: Secondary | ICD-10-CM | POA: Diagnosis not present

## 2024-06-08 DIAGNOSIS — Z8349 Family history of other endocrine, nutritional and metabolic diseases: Secondary | ICD-10-CM

## 2024-06-08 DIAGNOSIS — Z8261 Family history of arthritis: Secondary | ICD-10-CM

## 2024-06-08 DIAGNOSIS — R234 Changes in skin texture: Secondary | ICD-10-CM | POA: Diagnosis not present

## 2024-06-08 DIAGNOSIS — G894 Chronic pain syndrome: Secondary | ICD-10-CM | POA: Diagnosis present

## 2024-06-08 DIAGNOSIS — Z823 Family history of stroke: Secondary | ICD-10-CM

## 2024-06-08 DIAGNOSIS — Z808 Family history of malignant neoplasm of other organs or systems: Secondary | ICD-10-CM

## 2024-06-08 DIAGNOSIS — E785 Hyperlipidemia, unspecified: Secondary | ICD-10-CM | POA: Diagnosis present

## 2024-06-08 DIAGNOSIS — E66812 Obesity, class 2: Secondary | ICD-10-CM | POA: Diagnosis present

## 2024-06-08 DIAGNOSIS — Z79899 Other long term (current) drug therapy: Secondary | ICD-10-CM

## 2024-06-08 DIAGNOSIS — Z91013 Allergy to seafood: Secondary | ICD-10-CM | POA: Diagnosis not present

## 2024-06-08 DIAGNOSIS — K589 Irritable bowel syndrome without diarrhea: Secondary | ICD-10-CM | POA: Diagnosis present

## 2024-06-08 DIAGNOSIS — G9341 Metabolic encephalopathy: Secondary | ICD-10-CM | POA: Diagnosis present

## 2024-06-08 DIAGNOSIS — L539 Erythematous condition, unspecified: Secondary | ICD-10-CM | POA: Diagnosis not present

## 2024-06-08 DIAGNOSIS — Z8249 Family history of ischemic heart disease and other diseases of the circulatory system: Secondary | ICD-10-CM | POA: Diagnosis not present

## 2024-06-08 DIAGNOSIS — B955 Unspecified streptococcus as the cause of diseases classified elsewhere: Secondary | ICD-10-CM | POA: Diagnosis not present

## 2024-06-08 DIAGNOSIS — N390 Urinary tract infection, site not specified: Secondary | ICD-10-CM | POA: Diagnosis present

## 2024-06-08 DIAGNOSIS — R519 Headache, unspecified: Principal | ICD-10-CM | POA: Diagnosis present

## 2024-06-08 DIAGNOSIS — E871 Hypo-osmolality and hyponatremia: Secondary | ICD-10-CM | POA: Diagnosis present

## 2024-06-08 DIAGNOSIS — Z811 Family history of alcohol abuse and dependence: Secondary | ICD-10-CM

## 2024-06-08 DIAGNOSIS — B951 Streptococcus, group B, as the cause of diseases classified elsewhere: Secondary | ICD-10-CM | POA: Diagnosis not present

## 2024-06-08 HISTORY — DX: Anxiety disorder, unspecified: F41.9

## 2024-06-08 LAB — RESP PANEL BY RT-PCR (RSV, FLU A&B, COVID)  RVPGX2
Influenza A by PCR: NEGATIVE
Influenza B by PCR: NEGATIVE
Resp Syncytial Virus by PCR: NEGATIVE
SARS Coronavirus 2 by RT PCR: NEGATIVE

## 2024-06-08 LAB — CBC WITH DIFFERENTIAL/PLATELET
Abs Immature Granulocytes: 0.35 K/uL — ABNORMAL HIGH (ref 0.00–0.07)
Basophils Absolute: 0.1 K/uL (ref 0.0–0.1)
Basophils Relative: 0 %
Eosinophils Absolute: 0 K/uL (ref 0.0–0.5)
Eosinophils Relative: 0 %
HCT: 39.4 % (ref 39.0–52.0)
Hemoglobin: 13.3 g/dL (ref 13.0–17.0)
Immature Granulocytes: 1 %
Lymphocytes Relative: 4 %
Lymphs Abs: 1.2 K/uL (ref 0.7–4.0)
MCH: 27.7 pg (ref 26.0–34.0)
MCHC: 33.8 g/dL (ref 30.0–36.0)
MCV: 82.1 fL (ref 80.0–100.0)
Monocytes Absolute: 1.3 K/uL — ABNORMAL HIGH (ref 0.1–1.0)
Monocytes Relative: 5 %
Neutro Abs: 25.4 K/uL — ABNORMAL HIGH (ref 1.7–7.7)
Neutrophils Relative %: 90 %
Platelets: 259 K/uL (ref 150–400)
RBC: 4.8 MIL/uL (ref 4.22–5.81)
RDW: 13.2 % (ref 11.5–15.5)
Smear Review: NORMAL
WBC: 28.3 K/uL — ABNORMAL HIGH (ref 4.0–10.5)
nRBC: 0 % (ref 0.0–0.2)

## 2024-06-08 LAB — COMPREHENSIVE METABOLIC PANEL WITH GFR
ALT: 55 U/L — ABNORMAL HIGH (ref 0–44)
AST: 65 U/L — ABNORMAL HIGH (ref 15–41)
Albumin: 3 g/dL — ABNORMAL LOW (ref 3.5–5.0)
Alkaline Phosphatase: 69 U/L (ref 38–126)
Anion gap: 13 (ref 5–15)
BUN: 18 mg/dL (ref 6–20)
CO2: 25 mmol/L (ref 22–32)
Calcium: 8.9 mg/dL (ref 8.9–10.3)
Chloride: 99 mmol/L (ref 98–111)
Creatinine, Ser: 1.03 mg/dL (ref 0.61–1.24)
GFR, Estimated: >60 mL/min (ref >60)
Glucose, Bld: 127 mg/dL — ABNORMAL HIGH (ref 70–99)
Potassium: 3.9 mmol/L (ref 3.5–5.1)
Sodium: 137 mmol/L (ref 135–145)
Total Bilirubin: 0.8 mg/dL (ref 0.0–1.2)
Total Protein: 6.9 g/dL (ref 6.5–8.1)

## 2024-06-08 LAB — URINALYSIS, ROUTINE W REFLEX MICROSCOPIC
Bacteria, UA: NONE SEEN
Bilirubin Urine: NEGATIVE
Glucose, UA: NEGATIVE mg/dL
Ketones, ur: NEGATIVE mg/dL
Leukocytes,Ua: NEGATIVE
Nitrite: NEGATIVE
Protein, ur: NEGATIVE mg/dL
Specific Gravity, Urine: 1.021 (ref 1.005–1.030)
Squamous Epithelial / HPF: 0 /HPF (ref 0–5)
pH: 6 (ref 5.0–8.0)

## 2024-06-08 LAB — LACTIC ACID, PLASMA: Lactic Acid, Venous: 1.4 mmol/L (ref 0.5–1.9)

## 2024-06-08 MED ORDER — HYDRALAZINE HCL 20 MG/ML IJ SOLN: 5.0000 mg | Freq: Four times a day (QID) | INTRAMUSCULAR | Status: DC | PRN

## 2024-06-08 MED ORDER — METOPROLOL SUCCINATE ER 50 MG PO TB24
50.0000 mg | ORAL_TABLET | Freq: Every day | ORAL | Status: DC
  Administered 2024-06-09 – 2024-06-11 (×3): 50 mg via ORAL
  Filled 2024-06-08 (×3): qty 1

## 2024-06-08 MED ORDER — ATORVASTATIN CALCIUM 20 MG PO TABS
20.0000 mg | ORAL_TABLET | Freq: Every day | ORAL | Status: DC
  Administered 2024-06-09 – 2024-06-11 (×3): 20 mg via ORAL
  Filled 2024-06-08 (×3): qty 1

## 2024-06-08 MED ORDER — SODIUM CHLORIDE 0.9 % IV SOLN
2.0000 g | Freq: Two times a day (BID) | INTRAVENOUS | Status: DC
  Administered 2024-06-08 – 2024-06-09 (×2): 2 g via INTRAVENOUS
  Filled 2024-06-08 (×3): qty 20

## 2024-06-08 MED ORDER — ENOXAPARIN SODIUM 40 MG/0.4ML IJ SOSY
40.0000 mg | PREFILLED_SYRINGE | INTRAMUSCULAR | Status: DC
  Administered 2024-06-08 – 2024-06-10 (×3): 40 mg via SUBCUTANEOUS
  Filled 2024-06-08 (×3): qty 0.4

## 2024-06-08 MED ORDER — HYDROMORPHONE HCL 1 MG/ML IJ SOLN
1.0000 mg | Freq: Once | INTRAMUSCULAR | Status: AC
  Administered 2024-06-08: 1 mg via INTRAVENOUS
  Filled 2024-06-08: qty 1

## 2024-06-08 MED ORDER — LIDOCAINE HCL (PF) 1 % IJ SOLN
10.0000 mL | Freq: Once | INTRAMUSCULAR | Status: AC
  Administered 2024-06-08: 10 mL
  Filled 2024-06-08: qty 10

## 2024-06-08 MED ORDER — ALBUTEROL SULFATE (2.5 MG/3ML) 0.083% IN NEBU: 2.5000 mg | INHALATION_SOLUTION | RESPIRATORY_TRACT | Status: DC | PRN

## 2024-06-08 MED ORDER — HYDROMORPHONE HCL 1 MG/ML IJ SOLN: 0.5000 mg | Freq: Four times a day (QID) | INTRAMUSCULAR | Status: DC | PRN

## 2024-06-08 MED ORDER — NICOTINE 21 MG/24HR TD PT24
21.0000 mg | MEDICATED_PATCH | Freq: Every day | TRANSDERMAL | Status: DC
  Administered 2024-06-08: 21 mg via TRANSDERMAL
  Filled 2024-06-08 (×2): qty 1

## 2024-06-08 MED ORDER — VANCOMYCIN HCL IN DEXTROSE 1-5 GM/200ML-% IV SOLN: 1000.0000 mg | INTRAVENOUS | Status: DC

## 2024-06-08 MED ORDER — VANCOMYCIN HCL IN DEXTROSE 1-5 GM/200ML-% IV SOLN
1000.0000 mg | Freq: Once | INTRAVENOUS | Status: AC
  Administered 2024-06-08: 1000 mg via INTRAVENOUS
  Filled 2024-06-08: qty 200

## 2024-06-08 MED ORDER — KETOROLAC TROMETHAMINE 15 MG/ML IJ SOLN
15.0000 mg | Freq: Four times a day (QID) | INTRAMUSCULAR | Status: AC | PRN
  Administered 2024-06-09 – 2024-06-10 (×3): 15 mg via INTRAVENOUS
  Filled 2024-06-08 (×3): qty 1

## 2024-06-08 MED ORDER — HYDROMORPHONE HCL 1 MG/ML IJ SOLN
2.0000 mg | Freq: Once | INTRAMUSCULAR | Status: AC
  Administered 2024-06-08: 2 mg via INTRAVENOUS
  Filled 2024-06-08: qty 2

## 2024-06-08 MED ORDER — EPINEPHRINE 0.3 MG/0.3ML IJ SOAJ: 0.3000 mg | INTRAMUSCULAR | Status: DC | PRN

## 2024-06-08 MED ORDER — SODIUM CHLORIDE 0.9 % IV SOLN: INTRAVENOUS | Status: DC

## 2024-06-08 MED ORDER — VANCOMYCIN HCL 1500 MG/300ML IV SOLN
1500.0000 mg | Freq: Once | INTRAVENOUS | Status: AC
  Administered 2024-06-09: 1500 mg via INTRAVENOUS
  Filled 2024-06-08 (×2): qty 300

## 2024-06-08 MED ORDER — ONDANSETRON HCL 4 MG PO TABS: 4.0000 mg | ORAL_TABLET | Freq: Four times a day (QID) | ORAL | Status: DC | PRN

## 2024-06-08 MED ORDER — DEXTROSE 5 % IV SOLN
10.0000 mg/kg | Freq: Once | INTRAVENOUS | Status: AC
  Administered 2024-06-09: 1000 mg via INTRAVENOUS
  Filled 2024-06-08: qty 20

## 2024-06-08 MED ORDER — BISACODYL 5 MG PO TBEC: 5.0000 mg | DELAYED_RELEASE_TABLET | Freq: Every day | ORAL | Status: DC | PRN

## 2024-06-08 MED ORDER — ONDANSETRON HCL 4 MG/2ML IJ SOLN: 4.0000 mg | Freq: Four times a day (QID) | INTRAMUSCULAR | Status: DC | PRN

## 2024-06-08 MED ORDER — DICYCLOMINE HCL 10 MG PO CAPS: 10.0000 mg | ORAL_CAPSULE | Freq: Three times a day (TID) | ORAL | Status: DC | PRN

## 2024-06-08 MED ORDER — BUPRENORPHINE HCL 8 MG SL SUBL
8.0000 mg | SUBLINGUAL_TABLET | Freq: Two times a day (BID) | SUBLINGUAL | Status: DC
  Administered 2024-06-08 – 2024-06-11 (×5): 8 mg via SUBLINGUAL
  Filled 2024-06-08 (×6): qty 1

## 2024-06-08 NOTE — ED Notes (Signed)
 Requested med from pharmacy at this time.

## 2024-06-08 NOTE — H&P (Signed)
 History and Physical    Patient: Wyatt Murray FMW:[id number] DOB: [birth year] DOA: 06/08/2024 DOS: the patient was seen and examined on 06/08/2024 PCP: Joshua Debby CROME, MD  Patient coming from: Home  Chief Complaint:  Chief Complaint  Patient presents with   Altered Mental Status   HPI: Wyatt Murray is a 48 y.o. 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  He has been in his usual state of health but was noted by family members to be confused from his baseline earlier today.  Patient had reported having symptoms of fever chills and sweating.  He also reported having intense headache.  On account of the symptoms, patient was brought into the emergency room to be further evaluated.  Initial workup revealed tachycardia with markedly elevated WBC of 28,000.  There was initial concern for possible meningitis hence an LP was attempted in the ED.  Patient did not tolerate procedure well and asked for procedure to be deferred.  At the time of my evaluation, patient headache had resolved.  He feels clinically better.  Denies any photophobia or nuchal rigidity.  Denies any prior history of headaches.  Denies dysuria or increased frequency of micturition.  Denies any abdominal pain.  He admits to chronic abdominal pain for which he is on buprenorphine .  He is asking for his pain medications. Review of Systems: As mentioned in the history of present illness. All other systems reviewed and are negative. Past Medical History:  Diagnosis Date   Anxiety    Blood in stool    GERD (gastroesophageal reflux disease)    Hyperlipidemia    Hypertension    Pre-diabetes    Substance abuse (HCC)    pain meds   Past Surgical History:  Procedure Laterality Date   APPENDECTOMY     BOWEL RESECTION     HERNIA REPAIR      Inguinal x 10 surgeries with strangulated bowel resected   RADIOLOGY WITH ANESTHESIA Right 01/14/2023   Procedure: Right renal cryoablation;  Surgeon: Johann Sieving, MD;  Location: [facility] OR;  Service: Radiology;  Laterality: Right;   Social History:  reports that he quit smoking about 2 years ago. His smoking use included cigarettes. He has been exposed to tobacco smoke. He uses smokeless tobacco. He reports that he does not currently use alcohol. He reports that he does not currently use drugs.  Allergies  Allergen Reactions   Iodinated Contrast Media Shortness Of Breath and Rash    IV dye   Iodine Anaphylaxis   Midazolam Other (See Comments)    VERY ANGRY, AGITATED, TAKES OUT IV,   Shellfish Allergy Anaphylaxis   Atorvastatin  Other (See Comments)    Elevated liver enzymes    Family History  Problem Relation Age of Onset   Stroke Mother    Hypertension Mother    Hyperlipidemia Mother    Heart disease Mother        CABG   Diabetes Mother    Arthritis Mother    Cancer Father        throat   Arthritis Father    Alcohol abuse Father    Hypertension Paternal Grandfather    Hyperlipidemia Paternal Grandfather    Cancer Paternal Grandfather        pituatary, throat   Alcohol abuse Paternal Grandfather     Prior to Admission medications  Medication Sig Start Date End Date Taking? Authorizing Provider  metoprolol  succinate (TOPROL -XL) 50 MG 24 hr tablet Take 1 tablet (50 mg total) by mouth daily. Take with or immediately following a meal. 03/01/23   Joshua Debby CROME, MD  atorvastatin  (LIPITOR) 20 MG tablet TAKE 1 TABLET(20 MG) BY MOUTH DAILY 12/07/22   Joshua Debby CROME, MD  buprenorphine  (SUBUTEX ) 8 MG SUBL SL tablet Place 8 mg under the tongue 2 (two) times daily.    [provider]  dicyclomine  (BENTYL ) 10 MG capsule Take 1 capsule (10 mg total) by mouth 3 (three) times daily before meals. Patient taking differently: Take 10 mg by mouth 3 (three) times daily as needed  (Stomach pain). 06/08/22   Joshua Debby CROME, MD  EPINEPHrine  (EPIPEN  2-PAK) 0.3 mg/0.3 mL IJ SOAJ injection Inject 0.3 mg into the muscle as needed for anaphylaxis. 01/10/23   Joshua Debby CROME, MD  ergocalciferol (VITAMIN D2) 1.25 MG (50000 UT) capsule Take 50,000 Units by mouth once a week.    [provider]  lidocaine  (LIDODERM ) 5 % Place 1 patch onto the skin daily as needed (pain). Remove & Discard patch within 12 hours or as directed by MD    [provider]  Omega 3 1000 MG CAPS Take 1,000 mg by mouth daily.    [provider]  potassium chloride  SA (KLOR-CON  M15) 15 MEQ tablet Take 1 tablet (15 mEq total) by mouth 2 (two) times daily. 10/05/22   Joshua Debby CROME, MD  predniSONE  (DELTASONE ) 50 MG tablet Take one tablet at 13 hours, 7 hours and 1 hour prior to your procedure. 01/10/23   Covington, Jamie R, NP  torsemide  (DEMADEX ) 10 MG tablet Take 1 tablet (10 mg total) by mouth daily. 10/05/22   Joshua Debby CROME, MD    Physical Exam: Vitals:   06/08/24 1726 06/08/24 1727 06/08/24 1950 06/08/24 1955  BP: 113/78  (!) 90/59   Pulse: (!) 117  97 96  Resp: 18     Temp: 100.3 F (37.9 C)     TempSrc: Oral     SpO2: 91%  91% 93%  Weight:  99.8 kg    Height:  6' (1.829 m)     General: Patient was seen sitting up comfortably in bed.  Family at bedside.  Not in any acute distress. HEENT: Head is atraumatic pupils equal round reactive to light.  No photophobia with light noted. Neck: Supple with no obvious nuchal rigidity. Chest: Clinically diminished bilaterally with no added sounds. Abdomen with multiple surgical scars.  Bowel sounds present. CNS: No focal neurologic deficits. Extremities without any pedal edema Skin negative for any new rash  Data Reviewed: Lactic acid was 1.4, WBCs 28.3, hemoglobin 13.3, hematocrit 39.4, platelet count 259, neutrophil count 80, absolute neutrophils 25.4, glucose 127 Urinalysis were unremarkable, WBC was 0-5.  No bacteria seen And  chemistry panel showed sodium 137, potassium 3.9, chloride 99, bicarb 25, glucose 127, BUN 18, creatinine 1.01, AST 65, ALT 55  Head CT scan shows no acute intracranial malady.  Prior basal ganglia lacunar infarct noted.  Abdomen CT scan: Significant for nonspecific bilateral perinephric fat stranding.  Previously seen on prior studies.  Mild retroperitoneal lymphadenopathy, interpreted to be slightly increased compared to prior.  Thought to be likely reactive.  Left hydrocele  Assessment and Plan:  48 year old gentleman with history of multiple abdominal surgeries in the past, chronic pain syndrome on buprenorphine , hypertension, dyslipidemia who presents with fever chills and transient altered mental  status from baseline.  Source of infection at this point unclear.  Patient was consented for lumbar puncture but patient did not tolerate procedure hence this was deferred.  Patient is empirically covered with IV antibiotics and acyclovir .  #1.  Fever, headache and altered mental status.  No obvious foci of infection.  CT scan of the head were unremarkable.  Differentials may include meningitis/encephalitis, albeit low on the differential.  Other differentials include pelvic infection considering perinephric stranding and lymphadenopathy.  Patient was considered for lumbar puncture.  Attempts were not done in the ED, patient did not tolerate and has procedure was deferred.  Patient was empirically covered with IV antibiotics and also on acyclovir .  Respiratory PCR has been ordered and pending.  Consider IR eval for LP if symptoms do persist.  Scrotal ultrasound may also be considered considering lymphadenopathy and prior history of Right renal lesion.  #2.  Sepsis characterized by fever, tachycardia and endorgan damage including elevated LFTs.  Agree with IV fluids and IV antibiotics.  #3.  Chronic pain syndrome on chronic buprenorphine .  Will limit to pain medication to buprenorphine .  Breakthrough pain  management with nonopiate for now.  #4.  Irritable bowel syndrome/chronic abdominal pain secondary to multiple abdominal surgeries.  #5.  Hypertension/hyperlipidemia: Resume home medications as appropriate.  #6.  VTE prophylaxis    Advance Care Planning:   Code Status: Full Code   Consults: Urologist  Family Communication: Discussed care with patients fiancee and   Severity of Illness: The appropriate patient status for this patient is INPATIENT. Inpatient status is judged to be reasonable and necessary in order to provide the required intensity of service to ensure the patient's safety. The patient's presenting symptoms, physical exam findings, and initial radiographic and laboratory data in the context of their chronic comorbidities is felt to place them at high risk for further clinical deterioration. Furthermore, it is not anticipated that the patient will be medically stable for discharge from the hospital within 2 midnights of admission.   * I certify that at the point of admission it is my clinical judgment that the patient will require inpatient hospital care spanning beyond 2 midnights from the point of admission due to high intensity of service, high risk for further deterioration and high frequency of surveillance required.*  Author: Maude MARLA Dart, MD 06/08/2024 9:58 PM  For on call review [email or web address].

## 2024-06-08 NOTE — ED Provider Notes (Signed)
 [facility] Provider Note    Event Date/Time   First MD Initiated Contact with Patient 06/08/24 1847     (approximate)   History   Altered Mental Status   HPI  Wyatt Murray is a 48 y.o. 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  Wyatt Murray provides majority of history and reports that he is not acting normally, patient reports severe global headache without falls or syncope   Physical Exam   Triage Vital Signs: ED Triage Vitals  Encounter Vitals Group     BP 06/08/24 1726 113/78     Girls Systolic BP Percentile --      Girls Diastolic BP Percentile --      Boys Systolic BP Percentile --      Boys Diastolic BP Percentile --      Pulse Rate 06/08/24 1726 (!) 117     Resp 06/08/24 1726 18     Temp 06/08/24 1726 100.3 F (37.9 C)     Temp Source 06/08/24 1726 Oral     SpO2 06/08/24 1726 91 %     Weight 06/08/24 1727 220 lb (99.8 kg)     Height 06/08/24 1727 6' (1.829 m)     Head Circumference --      Peak Flow --      Pain Score 06/08/24 1726 2     Pain Loc --      Pain Education --      Exclude from Growth Chart --     Most recent vital signs: Vitals:   06/08/24 1950 06/08/24 1955  BP: (!) 90/59   Pulse: 97 96  Resp:    Temp:    SpO2: 91% 93%    General: Awake, no distress.  Photophobic and uncomfortable CV:  Good peripheral perfusion.  Resp:  Normal effort.  Abd:  No distention.  Well-healed midline surgical incision, diffuse tenderness without guarding or peritoneal features. MSK:  No deformity noted.  Neuro:  No focal deficits appreciated. Other:     ED Results / Procedures / Treatments   Labs (all labs ordered are listed, but only abnormal results are displayed) Labs Reviewed  CBC WITH DIFFERENTIAL/PLATELET - Abnormal; Notable for the following  components:      Result Value   WBC 28.3 (*)    Neutro Abs 25.4 (*)    Monocytes Absolute 1.3 (*)    Abs Immature Granulocytes 0.35 (*)    All other components within normal limits  COMPREHENSIVE METABOLIC PANEL WITH GFR - Abnormal; Notable for the following components:   Glucose, Bld 127 (*)    Albumin 3.0 (*)    AST 65 (*)    ALT 55 (*)    All other components within normal limits  URINALYSIS, ROUTINE W REFLEX MICROSCOPIC - Abnormal; Notable for the following components:   Color, Urine YELLOW (*)    APPearance CLEAR (*)    Hgb urine dipstick SMALL (*)    All other components within normal limits  CULTURE, BLOOD (SINGLE)  CULTURE, BLOOD (SINGLE)  CSF CULTURE W GRAM STAIN  ANAEROBIC CULTURE W GRAM STAIN  CULTURE, FUNGUS WITHOUT SMEAR  LACTIC ACID, PLASMA  CSF CELL COUNT WITH DIFFERENTIAL  CSF CELL COUNT WITH DIFFERENTIAL  PROTEIN AND GLUCOSE, CSF  HIV ANTIBODY (ROUTINE TESTING W REFLEX)  MENINGITIS/ENCEPHALITIS PANEL (  CSF)  CRYPTOCOCCAL ANTIGEN, CSF  OLIGOCLONAL BANDS, CSF + SERM  DRAW EXTRA CLOT TUBE  VDRL, CSF    EKG   RADIOLOGY CXR interpreted by me without evidence of acute cardiopulmonary pathology.  Official radiology report(s): DG Chest 1 View Result Date: 06/08/2024 CLINICAL DATA:  [id number] Fever [id number] EXAM: CHEST  1 VIEW COMPARISON:  04/21/2014 FINDINGS: Lower lung volumes. No focal airspace consolidation, pleural effusion, or pneumothorax. No cardiomegaly.No acute fracture or destructive lesion. IMPRESSION: No acute cardiopulmonary abnormality. Electronically Signed   By: Rogelia Myers M.D.   On: 06/08/2024 17:56    PROCEDURES and INTERVENTIONS:  .Critical Care  Performed by: Claudene Rover, MD Authorized by: Claudene Rover, MD   Critical care provider statement:    Critical care time (minutes):  30   Critical care time was exclusive of:  Separately billable procedures and treating other patients   Critical care was necessary to treat or prevent imminent  or life-threatening deterioration of the following conditions:  Sepsis and CNS failure or compromise   Critical care was time spent personally by me on the following activities:  Development of treatment plan with patient or surrogate, discussions with consultants, evaluation of patient's response to treatment, examination of patient, ordering and review of laboratory studies, ordering and review of radiographic studies, ordering and performing treatments and interventions, pulse oximetry, re-evaluation of patient's condition and review of old charts .1-3 Lead EKG Interpretation  Performed by: Claudene Rover, MD Authorized by: Claudene Rover, MD     Interpretation: abnormal     ECG rate:  110   ECG rate assessment: tachycardic     Rhythm: sinus tachycardia     Ectopy: none     Conduction: normal   Lumbar Puncture  Date/Time: 06/08/2024 8:41 PM  Performed by: Claudene Rover, MD Authorized by: Claudene Rover, MD   Consent:    Consent obtained:  Verbal   Consent given by:  Patient and spouse   Risks, benefits, and alternatives were discussed: yes   Pre-procedure details:    Procedure purpose:  Diagnostic   Preparation: Patient was prepped and draped in usual sterile fashion   Anesthesia:    Anesthesia method:  Local infiltration   Local anesthetic:  Lidocaine  1% w/o epi Procedure details:    Lumbar space:  L4-L5 interspace   Patient position:  Sitting   Needle gauge:  20   Needle length (in):  3.5   Number of attempts:  1 Post-procedure details:    Procedure completion:  Procedure terminated at patient's request Comments:     Poorly tolerated and terminated at patient's request   Medications  vancomycin  (VANCOCIN ) IVPB 1000 mg/200 mL premix (has no administration in time range)  cefTRIAXone  (ROCEPHIN ) 2 g in sodium chloride  0.9 % 100 mL IVPB (2 g Intravenous New Bag/Given 06/08/24 1956)  acyclovir  (ZOVIRAX ) 1,000 mg in dextrose  5 % 250 mL IVPB (has no administration in time range)  0.9  %  sodium chloride  infusion (has no administration in time range)  nicotine  (NICODERM CQ  - dosed in mg/24 hours) patch 21 mg (has no administration in time range)  HYDROmorphone  (DILAUDID ) injection 1 mg (1 mg Intravenous Given 06/08/24 1943)  lidocaine  (PF) (XYLOCAINE ) 1 % injection 10 mL (10 mLs Infiltration Given 06/08/24 1945)  HYDROmorphone  (DILAUDID ) injection 2 mg (2 mg Intravenous Given 06/08/24 2014)     IMPRESSION / MDM / ASSESSMENT AND PLAN / ED COURSE  I reviewed the triage vital signs and the nursing notes.  Differential diagnosis  includes, but is not limited to, meningitis or encephalitis, bacteremia, intracranial abscess, UTI, pneumonia, viral syndrome  {Patient presents with symptoms of an acute illness or injury that is potentially life-threatening.  Patient presents with signs of sepsis and severe headache concerning for possible meningitis.  No acute neurologic deficits or signs of trauma but clearly photophobic and uncomfortable.  Low-grade temperature, tachycardic and significant leukocytosis.  Normal lactic acid and metabolic panel.  Clear UA and CXR without clear etiology of his symptoms.  I attempted LP but this was poorly tolerated and patient demands I stop.  We will therefore obtain a CT scan of the head and a noncontrast abdomen/pelvis.  His chronic abdominal pain from previous surgeries and reports no acute changes, his only acute symptom is a headache but with an uncertain etiology of his stress criteria and sepsis I think it is reasonable to at least assess CT abdomen/pelvis as well.  I consult with hospitalist for admission pending the CT scans and they are agreeable.  Clinical Course as of 06/08/24 2046  Fri Jun 08, 2024  2033 Attempted LP but poorly tolerated, patient demands I stop and refuses to let me try again.  I discussed admission and IR attempting again tomorrow. [DS]  2044 I consulted medicine who agrees to admit [DS]    Clinical Course User Index [DS]  Claudene Rover, MD     FINAL CLINICAL IMPRESSION(S) / ED DIAGNOSES   Final diagnoses:  None     Rx / DC Orders   ED Discharge Orders     None        Note:  This document was prepared using Dragon voice recognition software and may include unintentional dictation errors.   Claudene Rover, MD 06/08/24 [phone]

## 2024-06-08 NOTE — Progress Notes (Signed)
 ED Pharmacy Antibiotic Sign Off An antibiotic consult was received from an ED provider for vancomycin  and acyclovir  per pharmacy dosing for menigitis. A chart review was completed to assess appropriateness.   The following one time order(s) were placed:  By MD vancomycin  1 gram IV x 1 By RPh acyclovir  1000mg  x 1 dose  Further antibiotic and/or antibiotic pharmacy consults should be ordered by the admitting provider if indicated.   Thank you for allowing pharmacy to be a part of this patient's care.   Suzann Allean LABOR [facility]  Clinical Pharmacist 06/08/24 7:36 PM

## 2024-06-08 NOTE — ED Triage Notes (Signed)
 Yesterday had headache. This morning became diaphoretic and not feeling well. Daughter reports he couldn't drive correctly around 2pm and when they got home he had some AMS and abdominal pain with nausea.   Has split on right heel from dry skin but more sore than normal today   Bilateral feet swelling is normal but more swelling than normal today.

## 2024-06-09 DIAGNOSIS — G9341 Metabolic encephalopathy: Secondary | ICD-10-CM | POA: Diagnosis present

## 2024-06-09 DIAGNOSIS — R7881 Bacteremia: Principal | ICD-10-CM

## 2024-06-09 DIAGNOSIS — B955 Unspecified streptococcus as the cause of diseases classified elsewhere: Secondary | ICD-10-CM

## 2024-06-09 LAB — BLOOD CULTURE ID PANEL (REFLEXED) - BCID2

## 2024-06-09 LAB — HEPATIC FUNCTION PANEL
ALT: 42 U/L (ref 0–44)
AST: 52 U/L — ABNORMAL HIGH (ref 15–41)
Albumin: 2.7 g/dL — ABNORMAL LOW (ref 3.5–5.0)
Alkaline Phosphatase: 50 U/L (ref 38–126)
Bilirubin, Direct: 0.2 mg/dL (ref 0.0–0.2)
Indirect Bilirubin: 0.5 mg/dL (ref 0.3–0.9)
Total Bilirubin: 0.7 mg/dL (ref 0.0–1.2)
Total Protein: 6.1 g/dL — ABNORMAL LOW (ref 6.5–8.1)

## 2024-06-09 LAB — URINE DRUG SCREEN, QUALITATIVE (ARMC ONLY)
Amphetamines, Ur Screen: NOT DETECTED
Barbiturates, Ur Screen: NOT DETECTED
Benzodiazepine, Ur Scrn: POSITIVE — AB
Cannabinoid 50 Ng, Ur ~~LOC~~: NOT DETECTED
Cocaine Metabolite,Ur ~~LOC~~: NOT DETECTED
MDMA (Ecstasy)Ur Screen: NOT DETECTED
Methadone Scn, Ur: NOT DETECTED
Opiate, Ur Screen: POSITIVE — AB
Phencyclidine (PCP) Ur S: NOT DETECTED
Tricyclic, Ur Screen: NOT DETECTED

## 2024-06-09 LAB — CBC
HCT: 34 % — ABNORMAL LOW (ref 39.0–52.0)
Hemoglobin: 11.3 g/dL — ABNORMAL LOW (ref 13.0–17.0)
MCH: 27.6 pg (ref 26.0–34.0)
MCHC: 33.2 g/dL (ref 30.0–36.0)
MCV: 82.9 fL (ref 80.0–100.0)
Platelets: 240 K/uL (ref 150–400)
RBC: 4.1 MIL/uL — ABNORMAL LOW (ref 4.22–5.81)
RDW: 13.4 % (ref 11.5–15.5)
WBC: 19.5 K/uL — ABNORMAL HIGH (ref 4.0–10.5)
nRBC: 0 % (ref 0.0–0.2)

## 2024-06-09 LAB — BASIC METABOLIC PANEL WITH GFR
Anion gap: 10 (ref 5–15)
BUN: 17 mg/dL (ref 6–20)
CO2: 23 mmol/L (ref 22–32)
Calcium: 8 mg/dL — ABNORMAL LOW (ref 8.9–10.3)
Chloride: 100 mmol/L (ref 98–111)
Creatinine, Ser: 1 mg/dL (ref 0.61–1.24)
GFR, Estimated: >60 mL/min (ref >60)
Glucose, Bld: 143 mg/dL — ABNORMAL HIGH (ref 70–99)
Potassium: 3.3 mmol/L — ABNORMAL LOW (ref 3.5–5.1)
Sodium: 133 mmol/L — ABNORMAL LOW (ref 135–145)

## 2024-06-09 LAB — BRAIN NATRIURETIC PEPTIDE: B Natriuretic Peptide: 106.2 pg/mL — ABNORMAL HIGH (ref 0.0–100.0)

## 2024-06-09 LAB — HIV ANTIBODY (ROUTINE TESTING W REFLEX): HIV Screen 4th Generation wRfx: NONREACTIVE

## 2024-06-09 LAB — MRSA NEXT GEN BY PCR, NASAL: MRSA by PCR Next Gen: NOT DETECTED

## 2024-06-09 MED ORDER — VANCOMYCIN HCL 1250 MG/250ML IV SOLN
1250.0000 mg | Freq: Two times a day (BID) | INTRAVENOUS | Status: DC
  Administered 2024-06-09: 1250 mg via INTRAVENOUS
  Filled 2024-06-09 (×2): qty 250

## 2024-06-09 MED ORDER — VANCOMYCIN HCL 1250 MG/250ML IV SOLN
1250.0000 mg | Freq: Two times a day (BID) | INTRAVENOUS | Status: DC
  Filled 2024-06-09: qty 250

## 2024-06-09 MED ORDER — DIAZEPAM 5 MG PO TABS
5.0000 mg | ORAL_TABLET | Freq: Every day | ORAL | Status: DC
Start: 2024-06-09 — End: 2024-06-12
  Administered 2024-06-09 – 2024-06-10 (×2): 5 mg via ORAL
  Filled 2024-06-09 (×2): qty 1

## 2024-06-09 MED ORDER — CEFAZOLIN SODIUM-DEXTROSE 2-4 GM/100ML-% IV SOLN
2.0000 g | Freq: Three times a day (TID) | INTRAVENOUS | Status: DC
  Administered 2024-06-09 – 2024-06-11 (×7): 2 g via INTRAVENOUS
  Filled 2024-06-09 (×8): qty 100

## 2024-06-09 MED ORDER — POTASSIUM CHLORIDE CRYS ER 10 MEQ PO TBCR
15.0000 meq | EXTENDED_RELEASE_TABLET | Freq: Every day | ORAL | Status: DC
  Administered 2024-06-09 – 2024-06-11 (×3): 15 meq via ORAL
  Filled 2024-06-09 (×3): qty 2

## 2024-06-09 MED ORDER — TORSEMIDE 20 MG PO TABS
10.0000 mg | ORAL_TABLET | Freq: Two times a day (BID) | ORAL | Status: DC
  Administered 2024-06-09 – 2024-06-10 (×3): 10 mg via ORAL
  Filled 2024-06-09 (×5): qty 1

## 2024-06-09 NOTE — Progress Notes (Signed)
 Pharmacy Antibiotic Note  Wyatt Murray is a 48 y.o. male admitted on 06/08/2024 with infection of unknown origin.  Pharmacy has been consulted for Vancomycin  dosing.  Plan: Vancomycin  1 gm IV X 1 given in ED on 9/12 @ 2121.  Additional Vanc 1500 mg IV X 1 ordered to make total loading dose of 2500 mg.  Vancomycin  1250 mg IV Q12H ordered to start on 9/13 @ 0900.  AUC = 503.1 Vanc trough = 13.7   Height: 6' (182.9 cm) Weight: 117.9 kg (259 lb 14.8 oz) IBW/kg (Calculated) : 77.6  Temp (24hrs), Avg:100.4 F (38 C), Min:100.3 F (37.9 C), Max:100.6 F (38.1 C)  Recent Labs  Lab 06/08/24 1749  WBC 28.3*  CREATININE 1.03  LATICACIDVEN 1.4    Estimated Creatinine Clearance: 116.2 mL/min (by C-G formula based on SCr of 1.03 mg/dL).    Allergies  Allergen Reactions   Iodinated Contrast Media Shortness Of Breath and Rash    IV dye   Iodine Anaphylaxis   Midazolam Other (See Comments)    VERY ANGRY, AGITATED, TAKES OUT IV,   Shellfish Allergy Anaphylaxis   Atorvastatin  Other (See Comments)    Elevated liver enzymes    Antimicrobials this admission:   >>    >>   Dose adjustments this admission:   Microbiology results:  BCx:   UCx:    Sputum:    MRSA PCR:   Thank you for allowing pharmacy to be a part of this patient's care.  Yenny Kosa D 06/09/2024 1:36 AM

## 2024-06-09 NOTE — Progress Notes (Addendum)
 Progress Note    Wyatt Murray  FMW:[id number] DOB: 29-Mar-1976  DOA: 06/08/2024 PCP: Joshua Debby CROME, MD      Brief Narrative:    Medical records reviewed and are as summarized below:  Wyatt Murray is a 48 y.o. 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  He presented to the hospital because of fever, chills, sweating, and altered mental status/confusion.   Vitals in the ED temperature 100.3 which went up to 100.6, respiratory rate 18, heart rate 117, O2 sat 91% on room air, BP 113/78  Labs significant for WBC 28.3, AST 65, ALT 55  LP was attempted in the ED but patient did not tolerate procedure so LP was aborted.   Assessment/Plan:   Principal Problem:   Streptococcal bacteremia Active Problems:   Headache   Acute metabolic encephalopathy   Body mass index is 35.25 kg/m.   Fever, headache, chills, confusion, sepsis secondary to Streptococcus LFT bacteremia: Blood cultures positive for streptococcal UTI (1 out of 4 bottles).  Discontinue IV vancomycin , ceftriaxone  and acyclovir .  De-escalated to IV cefazolin .  Obtain 2D echo for further evaluation.  Repeat blood cultures tonight. Discussed consulting interventional radiologist to attempt a lumbar puncture.  However, patient declined and said was not interested in LP. Case was discussed with Dr. Juli, ID specialist.   Acute metabolic encephalopathy: Resolved   Hyponatremia: Mild and asymptomatic.  Monitor BMP Hypokalemia: Replete potassium and monitor   Chronic peripheral edema: He takes torsemide  for peripheral edema.  He said he does not know why he has chronic swelling of the legs.  No prior heart disease, liver or kidney disease.   Further evaluation with 2D echo.  BNP was  106.2    Chronic abdominal pain from multiple surgeries, IBS, chronic pain syndrome: Continue Suboxone   Comorbidities include hypertension, hyperlipidemia     Diet Order             Diet Heart Fluid consistency: Thin  Diet effective now                                  Consultants: ID specialist  Procedures: None    Medications:    atorvastatin   20 mg Oral Daily   buprenorphine   8 mg Sublingual BID   enoxaparin  (LOVENOX ) injection  40 mg Subcutaneous Q24H   metoprolol  succinate  50 mg Oral Daily   nicotine   21 mg Transdermal Daily   potassium chloride  SA  15 mEq Oral Daily   torsemide   10 mg Oral BID   Continuous Infusions:  sodium chloride  Stopped (06/09/24 0007)    ceFAZolin  (ANCEF ) IV       Anti-infectives (From admission, onward)    Start     Dose/Rate Route Frequency Ordered Stop   06/09/24 1400  ceFAZolin  (ANCEF ) IVPB 2g/100 mL premix        2 g 200 mL/hr over 30 Minutes Intravenous Every 8 hours 06/09/24 1118     06/09/24 0900  vancomycin  (VANCOREADY) IVPB 1250 mg/250 mL  Status:  Discontinued        1,250 mg 166.7 mL/hr over 90 Minutes Intravenous Every 12 hours 06/09/24 0141 06/09/24 1118   06/09/24 0230  vancomycin  (VANCOREADY) IVPB 1250 mg/250  mL  Status:  Discontinued        1,250 mg 166.7 mL/hr over 90 Minutes Intravenous Every 12 hours 06/09/24 0136 06/09/24 0141   06/09/24 0000  vancomycin  (VANCOCIN ) IVPB 1000 mg/200 mL premix  Status:  Discontinued        1,000 mg 200 mL/hr over 60 Minutes Intravenous Every 24 hours 06/08/24 2224 06/08/24 2304   06/08/24 2300  vancomycin  (VANCOREADY) IVPB 1500 mg/300 mL        1,500 mg 150 mL/hr over 120 Minutes Intravenous  Once 06/08/24 2253 06/09/24 0336   06/08/24 2000  acyclovir  (ZOVIRAX ) 1,000 mg in dextrose  5 % 250 mL IVPB        10 mg/kg  99.8 kg 270 mL/hr over 60 Minutes Intravenous  Once 06/08/24 1949 06/09/24 0107   06/08/24 1930  vancomycin  (VANCOCIN ) IVPB 1000 mg/200 mL  premix        1,000 mg 200 mL/hr over 60 Minutes Intravenous  Once 06/08/24 1927 06/08/24 2356   06/08/24 1930  cefTRIAXone  (ROCEPHIN ) 2 g in sodium chloride  0.9 % 100 mL IVPB  Status:  Discontinued        2 g 200 mL/hr over 30 Minutes Intravenous Every 12 hours 06/08/24 1927 06/09/24 1118              Family Communication/Anticipated D/C date and plan/Code Status   DVT prophylaxis: enoxaparin  (LOVENOX ) injection 40 mg Start: 06/08/24 2200 SCDs Start: 06/08/24 2155     Code Status: Full Code  Family Communication: Plan discussed with [place], the bedside Disposition Plan: Plan to discharge home   Status is: Inpatient Remains inpatient appropriate because: Strep agalactiae bacteremia       Subjective:   Interval events noted.  He has general body/joint pain.  He feels much better today.  No rash, headache, neck pain, back pain, confusion, fever or chills.  No tick bites but he has had some mosquito bites lately.  Objective:    Vitals:   06/09/24 0000 06/09/24 0125 06/09/24 0539 06/09/24 0805  BP:  99/65 (!) 106/51 104/71  Pulse:  (!) 108 (!) 102 (!) 101  Resp:  20 20 18   Temp:  100.3 F (37.9 C) 100.1 F (37.8 C) 98.3 F (36.8 C)  TempSrc:  Oral Oral   SpO2:  98% 95% 96%  Weight: 117.9 kg     Height: 6' (1.829 m)      No data found.   Intake/Output Summary (Last 24 hours) at 06/09/2024 1122 Last data filed at 06/09/2024 0401 Gross per 24 hour  Intake 515.83 ml  Output 300 ml  Net 215.83 ml   Filed Weights   06/08/24 1727 06/09/24 0000  Weight: 99.8 kg 117.9 kg    Exam:  GEN: NAD SKIN: No rash EYES: No pallor or icterus ENT: MMM, neck is supple, negative Kernig's sign CV: RRR PULM: CTA B ABD: soft, obese, NT, +BS CNS: AAO x 3, non focal EXT: B/l leg and pedal edema, no tenderness        Data Reviewed:   I have personally reviewed following labs and imaging studies:  Labs: Labs show the following:   Basic Metabolic  Panel: Recent Labs  Lab 06/08/24 1749 06/09/24 0627  NA 137 133*  K 3.9 3.3*  CL 99 100  CO2 25 23  GLUCOSE 127* 143*  BUN 18 17  CREATININE 1.03 1.00  CALCIUM  8.9 8.0*   GFR Estimated Creatinine Clearance: 119.7 mL/min (by C-G formula based on SCr  of 1 mg/dL). Liver Function Tests: Recent Labs  Lab 06/08/24 1749 06/09/24 0627  AST 65* 52*  ALT 55* 42  ALKPHOS 69 50  BILITOT 0.8 0.7  PROT 6.9 6.1*  ALBUMIN 3.0* 2.7*   No results for input(s): LIPASE, AMYLASE in the last 168 hours. No results for input(s): AMMONIA in the last 168 hours. Coagulation profile No results for input(s): INR, PROTIME in the last 168 hours.  CBC: Recent Labs  Lab 06/08/24 1749 06/09/24 0627  WBC 28.3* 19.5*  NEUTROABS 25.4*  --   HGB 13.3 11.3*  HCT 39.4 34.0*  MCV 82.1 82.9  PLT 259 240   Cardiac Enzymes: No results for input(s): CKTOTAL, CKMB, CKMBINDEX, TROPONINI in the last 168 hours. BNP (last 3 results) No results for input(s): PROBNP in the last 8760 hours. CBG: No results for input(s): GLUCAP in the last 168 hours. D-Dimer: No results for input(s): DDIMER in the last 72 hours. Hgb A1c: No results for input(s): HGBA1C in the last 72 hours. Lipid Profile: No results for input(s): CHOL, HDL, LDLCALC, TRIG, CHOLHDL, LDLDIRECT in the last 72 hours. Thyroid  function studies: No results for input(s): TSH, T4TOTAL, T3FREE, THYROIDAB in the last 72 hours.  Invalid input(s): FREET3 Anemia work up: No results for input(s): VITAMINB12, FOLATE, FERRITIN, TIBC, IRON, RETICCTPCT in the last 72 hours. Sepsis Labs: Recent Labs  Lab 06/08/24 1749 06/09/24 0627  WBC 28.3* 19.5*  LATICACIDVEN 1.4  --     Microbiology Recent Results (from the past 240 hours)  Blood culture (single)     Status: None (Preliminary result)   Collection Time: 06/08/24  5:50 PM   Specimen: BLOOD  Result Value Ref Range Status   Specimen  Description BLOOD BLOOD RIGHT WRIST  Final   Special Requests   Final    BOTTLES DRAWN AEROBIC AND ANAEROBIC Blood Culture adequate volume   Culture   Final    NO GROWTH < 12 HOURS Performed at [facility], [address], [place], [place] [address]    Report Status PENDING  Incomplete  Blood culture (single)     Status: None (Preliminary result)   Collection Time: 06/08/24  7:01 PM   Specimen: BLOOD  Result Value Ref Range Status   Specimen Description BLOOD RIGHT ANTECUBITAL  Final   Special Requests   Final    BOTTLES DRAWN AEROBIC AND ANAEROBIC Blood Culture results may not be optimal due to an inadequate volume of blood received in culture bottles   Culture  Setup Time   Final    GRAM POSITIVE COCCI AEROBIC BOTTLE ONLY Organism ID to follow CRITICAL RESULT CALLED TO, READ BACK BY AND VERIFIED WITH:  SHEEMA H. AT 1000 06/09/24 RAM Performed at [facility], [address] [place], [place] [address]    Culture GRAM POSITIVE COCCI  Final   Report Status PENDING  Incomplete  Blood Culture ID Panel (Reflexed)     Status: Abnormal   Collection Time: 06/08/24  7:01 PM  Result Value Ref Range Status   Enterococcus faecalis NOT DETECTED NOT DETECTED Final   Enterococcus Faecium NOT DETECTED NOT DETECTED Final   Listeria monocytogenes NOT DETECTED NOT DETECTED Final   Staphylococcus species NOT DETECTED NOT DETECTED Final   Staphylococcus aureus (BCID) NOT DETECTED NOT DETECTED Final   Staphylococcus epidermidis NOT DETECTED NOT DETECTED Final   Staphylococcus lugdunensis NOT DETECTED NOT DETECTED Final   Streptococcus species DETECTED (A) NOT DETECTED Final    Comment: CRITICAL RESULT CALLED  TO, READ BACK BY AND VERIFIED WITH: SHEEMA H. AT 1000 06/09/24 RAM    Streptococcus agalactiae DETECTED (A) NOT DETECTED Final    Comment: CRITICAL RESULT CALLED TO, READ BACK BY AND VERIFIED WITH: SHEEMA H. AT 1000 06/09/24 RAM    Streptococcus pneumoniae NOT DETECTED NOT  DETECTED Final   Streptococcus pyogenes NOT DETECTED NOT DETECTED Final   A.calcoaceticus-baumannii NOT DETECTED NOT DETECTED Final   Bacteroides fragilis NOT DETECTED NOT DETECTED Final   Enterobacterales NOT DETECTED NOT DETECTED Final   Enterobacter cloacae complex NOT DETECTED NOT DETECTED Final   Escherichia coli NOT DETECTED NOT DETECTED Final   Klebsiella aerogenes NOT DETECTED NOT DETECTED Final   Klebsiella oxytoca NOT DETECTED NOT DETECTED Final   Klebsiella pneumoniae NOT DETECTED NOT DETECTED Final   Proteus species NOT DETECTED NOT DETECTED Final   Salmonella species NOT DETECTED NOT DETECTED Final   Serratia marcescens NOT DETECTED NOT DETECTED Final   Haemophilus influenzae NOT DETECTED NOT DETECTED Final   Neisseria meningitidis NOT DETECTED NOT DETECTED Final   Pseudomonas aeruginosa NOT DETECTED NOT DETECTED Final   Stenotrophomonas maltophilia NOT DETECTED NOT DETECTED Final   Candida albicans NOT DETECTED NOT DETECTED Final   Candida auris NOT DETECTED NOT DETECTED Final   Candida glabrata NOT DETECTED NOT DETECTED Final   Candida krusei NOT DETECTED NOT DETECTED Final   Candida parapsilosis NOT DETECTED NOT DETECTED Final   Candida tropicalis NOT DETECTED NOT DETECTED Final   Cryptococcus neoformans/gattii NOT DETECTED NOT DETECTED Final    Comment: Performed at [facility], [address] [place], [place] [address]  Resp panel by RT-PCR (RSV, Flu A&B, Covid) Anterior Nasal Swab     Status: None   Collection Time: 06/08/24 10:21 PM   Specimen: Anterior Nasal Swab  Result Value Ref Range Status   SARS Coronavirus 2 by RT PCR NEGATIVE NEGATIVE Final    Comment: (NOTE) SARS-CoV-2 target nucleic acids are NOT DETECTED.  The SARS-CoV-2 RNA is generally detectable in upper respiratory specimens during the acute phase of infection. The lowest concentration of SARS-CoV-2 viral copies this assay can detect is 138 copies/mL. A negative result does not  preclude SARS-Cov-2 infection and should not be used as the sole basis for treatment or other patient management decisions. A negative result may occur with  improper specimen collection/handling, submission of specimen other than nasopharyngeal swab, presence of viral mutation(s) within the areas targeted by this assay, and inadequate number of viral copies(<138 copies/mL). A negative result must be combined with clinical observations, patient history, and epidemiological information. The expected result is Negative.  Fact Sheet for Patients:  BloggerCourse.com  Fact Sheet for Healthcare Providers:  SeriousBroker.it  This test is no t yet approved or cleared by the [place]  FDA and  has been authorized for detection and/or diagnosis of SARS-CoV-2 by FDA under an Emergency Use Authorization (EUA). This EUA will remain  in effect (meaning this test can be used) for the duration of the COVID-19 declaration under Section 564(b)(1) of the Act, 21 U.S.C.section 360bbb-3(b)(1), unless the authorization is terminated  or revoked sooner.       Influenza A by PCR NEGATIVE NEGATIVE Final   Influenza B by PCR NEGATIVE NEGATIVE Final    Comment: (NOTE) The Xpert Xpress SARS-CoV-2/FLU/RSV plus assay is intended as an aid in the diagnosis of influenza from Nasopharyngeal swab specimens and should not be used as a sole basis for treatment. Nasal washings and aspirates are unacceptable for Xpert Xpress  SARS-CoV-2/FLU/RSV testing.  Fact Sheet for Patients: BloggerCourse.com  Fact Sheet for Healthcare Providers: SeriousBroker.it  This test is not yet approved or cleared by the [place]  FDA and has been authorized for detection and/or diagnosis of SARS-CoV-2 by FDA under an Emergency Use Authorization (EUA). This EUA will remain in effect (meaning this test can be used) for the  duration of the COVID-19 declaration under Section 564(b)(1) of the Act, 21 U.S.C. section 360bbb-3(b)(1), unless the authorization is terminated or revoked.     Resp Syncytial Virus by PCR NEGATIVE NEGATIVE Final    Comment: (NOTE) Fact Sheet for Patients: BloggerCourse.com  Fact Sheet for Healthcare Providers: SeriousBroker.it  This test is not yet approved or cleared by the [place]  FDA and has been authorized for detection and/or diagnosis of SARS-CoV-2 by FDA under an Emergency Use Authorization (EUA). This EUA will remain in effect (meaning this test can be used) for the duration of the COVID-19 declaration under Section 564(b)(1) of the Act, 21 U.S.C. section 360bbb-3(b)(1), unless the authorization is terminated or revoked.  Performed at [facility], [address] [place], [place] [address]     Procedures and diagnostic studies:  CT ABDOMEN PELVIS WO CONTRAST Result Date: 06/08/2024 CLINICAL DATA:  Sepsis and chronic abdominal pain. EXAM: CT ABDOMEN AND PELVIS WITHOUT CONTRAST TECHNIQUE: Multidetector CT imaging of the abdomen and pelvis was performed following the standard protocol without IV contrast. RADIATION DOSE REDUCTION: This exam was performed according to the departmental dose-optimization program which includes automated exposure control, adjustment of the mA and/or kV according to patient size and/or use of iterative reconstruction technique. COMPARISON:  CT abdomen 05/09/2023. CT abdomen and pelvis 01/11/2023. FINDINGS: Lower chest: No acute abnormality. Hepatobiliary: No focal liver abnormality is seen. No gallstones, gallbladder wall thickening, or biliary dilatation. Pancreas: Unremarkable. No pancreatic ductal dilatation or surrounding inflammatory changes. Spleen: Normal in size without focal abnormality. Adrenals/Urinary Tract: There is no urinary tract calculus or hydronephrosis. There is mild  nonspecific bilateral perinephric fat stranding which is similar to the prior study. Adrenal glands are within normal limits. The bladder is decompressed. Stomach/Bowel: Stomach is within normal limits. No evidence of bowel wall thickening, distention, or inflammatory changes. Small bowel anastomosis is present. Appendix is not seen. Vascular/Lymphatic: Aorta and IVC are normal in size. There are prominent and mildly enlarged periaortic lymph nodes measuring up to 11 mm. These have slightly increased in size from prior. Reproductive: Prostate is unremarkable.  Left hydrocele present. Other: No abdominal wall hernia or abnormality. No abdominopelvic ascites. Right inguinal hernia repair mesh present. Musculoskeletal: No fracture is seen. IMPRESSION: 1. No acute localizing process in the abdomen or pelvis. 2. Mild nonspecific bilateral perinephric fat stranding, similar to the prior study. 3. Mild retroperitoneal lymphadenopathy, slightly increased in size from prior. Findings may be reactive, but metastatic disease can not be excluded. 4. Left hydrocele. Electronically Signed   By: Greig Pique M.D.   On: 06/08/2024 21:10   CT HEAD WO CONTRAST ( ) Result Date: 06/08/2024 CLINICAL DATA:  Severe headaches for 2 days, initial encounter EXAM: CT HEAD WITHOUT CONTRAST TECHNIQUE: Contiguous axial images were obtained from the base of the skull through the vertex without intravenous contrast. RADIATION DOSE REDUCTION: This exam was performed according to the departmental dose-optimization program which includes automated exposure control, adjustment of the mA and/or kV according to patient size and/or use of iterative reconstruction technique. COMPARISON:  None Available. FINDINGS: Brain: No evidence of acute infarction, hemorrhage, hydrocephalus, extra-axial collection or  mass lesion/mass effect. Focal lacunar infarct is noted in the left basal ganglia. Slight asymmetry of the lateral ventricles is noted of uncertain  significance. Vascular: No hyperdense vessel or unexpected calcification. Skull: Normal. Negative for fracture or focal lesion. Sinuses/Orbits: No acute finding. Other: None. IMPRESSION: No acute intracranial abnormality noted. Findings consistent with prior lacunar infarct in the left basal ganglia. Electronically Signed   By: Oneil Devonshire M.D.   On: 06/08/2024 21:10   DG Chest 1 View Result Date: 06/08/2024 CLINICAL DATA:  [id number] Fever [id number] EXAM: CHEST  1 VIEW COMPARISON:  04/21/2014 FINDINGS: Lower lung volumes. No focal airspace consolidation, pleural effusion, or pneumothorax. No cardiomegaly.No acute fracture or destructive lesion. IMPRESSION: No acute cardiopulmonary abnormality. Electronically Signed   By: Rogelia Myers M.D.   On: 06/08/2024 17:56               LOS: 1 day   Melenie Minniear  [facility]   Pager on [email or web address]. If 7PM-7AM, please contact night-coverage at [email or web address]     06/09/2024, 11:22 AM

## 2024-06-09 NOTE — Progress Notes (Signed)
   06/09/24 0000  Assess: if the MEWS score is Yellow or Red  Were vital signs accurate and taken at a resting state? Yes  Does the patient meet 2 or more of the SIRS criteria? No  MEWS guidelines implemented  Yes, yellow  Treat  MEWS Interventions Considered administering scheduled or prn medications/treatments as ordered  Take Vital Signs  Increase Vital Sign Frequency  Yellow: Q2hr x1, continue Q4hrs until patient remains green for 12hrs  Escalate  MEWS: Escalate Yellow: Discuss with charge nurse and consider notifying provider and/or RRT  Notify: Charge Nurse/RN  Name of Charge Nurse/RN Notified TConyers  Provider Notification  Provider Name/Title VEAR Solian  Date Provider Notified 06/09/24  Time Provider Notified 0000  Method of Notification Page (chat)  Notification Reason Change in status (yellow mews)  Provider response No new orders  Date of Provider Response 06/09/24  Time of Provider Response 0001

## 2024-06-09 NOTE — Progress Notes (Addendum)
 PHARMACY - PHYSICIAN COMMUNICATION CRITICAL VALUE ALERT - BLOOD CULTURE IDENTIFICATION (BCID)  Wyatt Murray is an 48 y.o. male who presented to [facility] on 06/08/2024 with AMS  Assessment: BCx 1 of 4 bottles GPC. BCID shows Group B Strep (Strep agalactiae).  Patient is on Ceftriaxone  and Vancomycin  for possible meningitis.   Name of physician (or Provider) Contacted: Dr. Jens   Current antibiotics: Vancomycin , Ceftriaxone    Changes to prescribed antibiotics recommended:  Cefazolin  2g IV every 8 hours per ID  Results for orders placed or performed during the hospital encounter of 06/08/24  Blood Culture ID Panel (Reflexed) (Collected: 06/08/2024  7:01 PM)  Result Value Ref Range   Enterococcus faecalis NOT DETECTED NOT DETECTED   Enterococcus Faecium NOT DETECTED NOT DETECTED   Listeria monocytogenes NOT DETECTED NOT DETECTED   Staphylococcus species NOT DETECTED NOT DETECTED   Staphylococcus aureus (BCID) NOT DETECTED NOT DETECTED   Staphylococcus epidermidis NOT DETECTED NOT DETECTED   Staphylococcus lugdunensis NOT DETECTED NOT DETECTED   Streptococcus species DETECTED (A) NOT DETECTED   Streptococcus agalactiae DETECTED (A) NOT DETECTED   Streptococcus pneumoniae NOT DETECTED NOT DETECTED   Streptococcus pyogenes NOT DETECTED NOT DETECTED   A.calcoaceticus-baumannii NOT DETECTED NOT DETECTED   Bacteroides fragilis NOT DETECTED NOT DETECTED   Enterobacterales NOT DETECTED NOT DETECTED   Enterobacter cloacae complex NOT DETECTED NOT DETECTED   Escherichia coli NOT DETECTED NOT DETECTED   Klebsiella aerogenes NOT DETECTED NOT DETECTED   Klebsiella oxytoca NOT DETECTED NOT DETECTED   Klebsiella pneumoniae NOT DETECTED NOT DETECTED   Proteus species NOT DETECTED NOT DETECTED   Salmonella species NOT DETECTED NOT DETECTED   Serratia marcescens NOT DETECTED NOT DETECTED   Haemophilus influenzae NOT DETECTED NOT DETECTED   Neisseria meningitidis NOT DETECTED NOT DETECTED    Pseudomonas aeruginosa NOT DETECTED NOT DETECTED   Stenotrophomonas maltophilia NOT DETECTED NOT DETECTED   Candida albicans NOT DETECTED NOT DETECTED   Candida auris NOT DETECTED NOT DETECTED   Candida glabrata NOT DETECTED NOT DETECTED   Candida krusei NOT DETECTED NOT DETECTED   Candida parapsilosis NOT DETECTED NOT DETECTED   Candida tropicalis NOT DETECTED NOT DETECTED   Cryptococcus neoformans/gattii NOT DETECTED NOT DETECTED    Estill CHRISTELLA Lutes, PharmD, BCPS Clinical Pharmacist 06/09/2024 11:32 AM

## 2024-06-10 ENCOUNTER — Inpatient Hospital Stay
Admit: 2024-06-10 | Discharge: 2024-06-10 | Disposition: A | Discharge status: Home / Self Care | Attending: Internal Medicine | Admitting: Internal Medicine

## 2024-06-10 DIAGNOSIS — B955 Unspecified streptococcus as the cause of diseases classified elsewhere: Secondary | ICD-10-CM | POA: Diagnosis not present

## 2024-06-10 DIAGNOSIS — R7881 Bacteremia: Secondary | ICD-10-CM | POA: Diagnosis not present

## 2024-06-10 LAB — BASIC METABOLIC PANEL WITH GFR
Anion gap: 9 (ref 5–15)
BUN: 14 mg/dL (ref 6–20)
CO2: 26 mmol/L (ref 22–32)
Calcium: 8.2 mg/dL — ABNORMAL LOW (ref 8.9–10.3)
Chloride: 101 mmol/L (ref 98–111)
Creatinine, Ser: 0.9 mg/dL (ref 0.61–1.24)
GFR, Estimated: >60 mL/min (ref >60)
Glucose, Bld: 120 mg/dL — ABNORMAL HIGH (ref 70–99)
Potassium: 3.5 mmol/L (ref 3.5–5.1)
Sodium: 136 mmol/L (ref 135–145)

## 2024-06-10 LAB — ECHOCARDIOGRAM COMPLETE
AR max vel: 3.8 cm2
AV Peak grad: 6.6 mmHg
Ao pk vel: 1.28 m/s
Area-P 1/2: 7.59 cm2
Height: 72 in
S' Lateral: 3.5 cm
Weight: 4158.76 [oz_av]

## 2024-06-10 LAB — CBC
HCT: 36.2 % — ABNORMAL LOW (ref 39.0–52.0)
Hemoglobin: 12.1 g/dL — ABNORMAL LOW (ref 13.0–17.0)
MCH: 28.1 pg (ref 26.0–34.0)
MCHC: 33.4 g/dL (ref 30.0–36.0)
MCV: 84 fL (ref 80.0–100.0)
Platelets: 235 K/uL (ref 150–400)
RBC: 4.31 MIL/uL (ref 4.22–5.81)
RDW: 13.5 % (ref 11.5–15.5)
WBC: 8.3 K/uL (ref 4.0–10.5)
nRBC: 0 % (ref 0.0–0.2)

## 2024-06-10 LAB — MAGNESIUM: Magnesium: 2.2 mg/dL (ref 1.7–2.4)

## 2024-06-10 NOTE — Progress Notes (Signed)
 Progress Note    Wyatt Murray  FMW:[id number] DOB: 01/15/1976  DOA: 06/08/2024 PCP: Joshua Debby CROME, MD      Brief Narrative:    Medical records reviewed and are as summarized below:  Wyatt Murray is a 48 y.o. 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  He presented to the hospital because of fever, chills, sweating, and altered mental status/confusion.   Vitals in the ED temperature 100.3 which went up to 100.6, respiratory rate 18, heart rate 117, O2 sat 91% on room air, BP 113/78  Labs significant for WBC 28.3, AST 65, ALT 55  LP was attempted in the ED but patient did not tolerate procedure so LP was aborted.   Assessment/Plan:   Principal Problem:   Streptococcal bacteremia Active Problems:   Headache   Acute metabolic encephalopathy   Body mass index is 35.25 kg/m.   Fever, headache, chills, confusion, sepsis secondary to Streptococcus LFT bacteremia: Blood cultures positive for streptococcal UTI (1 out of 4 bottles).  No growth thus far on repeat blood cultures from 06/09/2024.  2D echo is pending.   Continue IV Ancef  Patient had declined LP after previous unsuccessful attempt   Acute metabolic encephalopathy: Resolved   Hyponatremia: Improved Hypokalemia: Improved.  Continue potassium repletion.   Chronic peripheral edema: He takes torsemide  for peripheral edema.  He said he does not know why he has chronic swelling of the legs.  No prior heart disease, liver or kidney disease.   Further evaluation with 2D echo.  BNP was 106.2    Chronic abdominal pain from multiple surgeries, IBS, chronic pain syndrome: Continue Suboxone   Comorbidities include hypertension, hyperlipidemia     Diet Order             Diet Heart  Fluid consistency: Thin  Diet effective now                                  Consultants: ID specialist  Procedures: None    Medications:    atorvastatin   20 mg Oral Daily   buprenorphine   8 mg Sublingual BID   diazepam   5 mg Oral QHS   enoxaparin  (LOVENOX ) injection  40 mg Subcutaneous Q24H   metoprolol  succinate  50 mg Oral Daily   nicotine   21 mg Transdermal Daily   potassium chloride  SA  15 mEq Oral Daily   torsemide   10 mg Oral BID   Continuous Infusions:   ceFAZolin  (ANCEF ) IV 200 mL/hr at 06/10/24 0644     Anti-infectives (From admission, onward)    Start     Dose/Rate Route Frequency Ordered Stop   06/09/24 1400  ceFAZolin  (ANCEF ) IVPB 2g/100 mL premix        2 g 200 mL/hr over 30 Minutes Intravenous Every 8 hours 06/09/24 1118     06/09/24 0900  vancomycin  (VANCOREADY) IVPB 1250 mg/250 mL  Status:  Discontinued        1,250 mg 166.7 mL/hr over 90 Minutes Intravenous Every 12 hours 06/09/24 0141 06/09/24 1118   06/09/24 0230  vancomycin  (VANCOREADY) IVPB 1250 mg/250 mL  Status:  Discontinued        1,250 mg 166.7 mL/hr over 90 Minutes Intravenous Every 12 hours  06/09/24 0136 06/09/24 0141   06/09/24 0000  vancomycin  (VANCOCIN ) IVPB 1000 mg/200 mL premix  Status:  Discontinued        1,000 mg 200 mL/hr over 60 Minutes Intravenous Every 24 hours 06/08/24 2224 06/08/24 2304   06/08/24 2300  vancomycin  (VANCOREADY) IVPB 1500 mg/300 mL        1,500 mg 150 mL/hr over 120 Minutes Intravenous  Once 06/08/24 2253 06/09/24 0800   06/08/24 2000  acyclovir  (ZOVIRAX ) 1,000 mg in dextrose  5 % 250 mL IVPB        10 mg/kg  99.8 kg 270 mL/hr over 60 Minutes Intravenous  Once 06/08/24 1949 06/09/24 0700   06/08/24 1930  vancomycin  (VANCOCIN ) IVPB 1000 mg/200 mL premix        1,000 mg 200 mL/hr over 60 Minutes Intravenous  Once 06/08/24 1927 06/08/24 2356   06/08/24 1930  cefTRIAXone  (ROCEPHIN ) 2 g in sodium chloride  0.9 % 100 mL IVPB  Status:   Discontinued        2 g 200 mL/hr over 30 Minutes Intravenous Every 12 hours 06/08/24 1927 06/09/24 1118              Family Communication/Anticipated D/C date and plan/Code Status   DVT prophylaxis: enoxaparin  (LOVENOX ) injection 40 mg Start: 06/08/24 2200 SCDs Start: 06/08/24 2155     Code Status: Full Code  Family Communication: Plan discussed with [place], the bedside Disposition Plan: Plan to discharge home   Status is: Inpatient Remains inpatient appropriate because: Strep agalactiae bacteremia       Subjective:   No acute events overnight.  No complaints.  He feels better.  Objective:    Vitals:   06/09/24 1554 06/09/24 1945 06/10/24 0413 06/10/24 0805  BP: 90/63 112/66 109/64 114/77  Pulse: 95 (!) 102 99 82  Resp: 16 16 18 15   Temp: 98.4 F (36.9 C) 99.1 F (37.3 C) 98.9 F (37.2 C) 98.7 F (37.1 C)  TempSrc: Oral Oral Oral   SpO2: 93% 95% 96% 91%  Weight:      Height:       No data found.   Intake/Output Summary (Last 24 hours) at 06/10/2024 1156 Last data filed at 06/10/2024 0644 Gross per 24 hour  Intake 1648.11 ml  Output --  Net 1648.11 ml   Filed Weights   06/08/24 1727 06/09/24 0000  Weight: 99.8 kg 117.9 kg    Exam:  GEN: NAD SKIN: No rash EYES: No pallor or icterus ENT: MMM CV: RRR PULM: CTA B ABD: soft, obese, NT, +BS CNS: AAO x 3, non focal EXT: Bilateral leg and pedal edema         Data Reviewed:   I have personally reviewed following labs and imaging studies:  Labs: Labs show the following:   Basic Metabolic Panel: Recent Labs  Lab 06/08/24 1749 06/09/24 0627 06/10/24 1029  NA 137 133* 136  K 3.9 3.3* 3.5  CL 99 100 101  CO2 25 23 26   GLUCOSE 127* 143* 120*  BUN 18 17 14   CREATININE 1.03 1.00 0.90  CALCIUM  8.9 8.0* 8.2*  MG  --   --  2.2   GFR Estimated Creatinine Clearance: 133 mL/min (by C-G formula based on SCr of 0.9 mg/dL). Liver Function Tests: Recent Labs  Lab  06/08/24 1749 06/09/24 0627  AST 65* 52*  ALT 55* 42  ALKPHOS 69 50  BILITOT 0.8 0.7  PROT 6.9 6.1*  ALBUMIN 3.0* 2.7*   No results for  input(s): LIPASE, AMYLASE in the last 168 hours. No results for input(s): AMMONIA in the last 168 hours. Coagulation profile No results for input(s): INR, PROTIME in the last 168 hours.  CBC: Recent Labs  Lab 06/08/24 1749 06/09/24 0627 06/10/24 1029  WBC 28.3* 19.5* 8.3  NEUTROABS 25.4*  --   --   HGB 13.3 11.3* 12.1*  HCT 39.4 34.0* 36.2*  MCV 82.1 82.9 84.0  PLT 259 240 235   Cardiac Enzymes: No results for input(s): CKTOTAL, CKMB, CKMBINDEX, TROPONINI in the last 168 hours. BNP (last 3 results) No results for input(s): PROBNP in the last 8760 hours. CBG: No results for input(s): GLUCAP in the last 168 hours. D-Dimer: No results for input(s): DDIMER in the last 72 hours. Hgb A1c: No results for input(s): HGBA1C in the last 72 hours. Lipid Profile: No results for input(s): CHOL, HDL, LDLCALC, TRIG, CHOLHDL, LDLDIRECT in the last 72 hours. Thyroid  function studies: No results for input(s): TSH, T4TOTAL, T3FREE, THYROIDAB in the last 72 hours.  Invalid input(s): FREET3 Anemia work up: No results for input(s): VITAMINB12, FOLATE, FERRITIN, TIBC, IRON, RETICCTPCT in the last 72 hours. Sepsis Labs: Recent Labs  Lab 06/08/24 1749 06/09/24 0627 06/10/24 1029  WBC 28.3* 19.5* 8.3  LATICACIDVEN 1.4  --   --     Microbiology Recent Results (from the past 240 hours)  Blood culture (single)     Status: None (Preliminary result)   Collection Time: 06/08/24  5:50 PM   Specimen: BLOOD  Result Value Ref Range Status   Specimen Description BLOOD BLOOD RIGHT WRIST  Final   Special Requests   Final    BOTTLES DRAWN AEROBIC AND ANAEROBIC Blood Culture adequate volume   Culture   Final    NO GROWTH 2 DAYS Performed at [facility], [address], [place],  [place] [address]    Report Status PENDING  Incomplete  Blood culture (single)     Status: Abnormal (Preliminary result)   Collection Time: 06/08/24  7:01 PM   Specimen: BLOOD  Result Value Ref Range Status   Specimen Description   Final    BLOOD RIGHT ANTECUBITAL Performed at [facility], [address], [place], [place] [address]    Special Requests   Final    BOTTLES DRAWN AEROBIC AND ANAEROBIC Blood Culture results may not be optimal due to an inadequate volume of blood received in culture bottles Performed at [facility], [address] [place], [place] [address]    Culture  Setup Time   Final    GRAM POSITIVE COCCI IN BOTH AEROBIC AND ANAEROBIC BOTTLES CRITICAL RESULT CALLED TO, READ BACK BY AND VERIFIED WITH:  SHEEMA H. AT 1000 06/09/24 RAM    Culture (A)  Final    GROUP B STREP(S.AGALACTIAE)ISOLATED SUSCEPTIBILITIES TO FOLLOW Performed at [facility], [address], [place], [place] [address]    Report Status PENDING  Incomplete  Blood Culture ID Panel (Reflexed)     Status: Abnormal   Collection Time: 06/08/24  7:01 PM  Result Value Ref Range Status   Enterococcus faecalis NOT DETECTED NOT DETECTED Final   Enterococcus Faecium NOT DETECTED NOT DETECTED Final   Listeria monocytogenes NOT DETECTED NOT DETECTED Final   Staphylococcus species NOT DETECTED NOT DETECTED Final   Staphylococcus aureus (BCID) NOT DETECTED NOT DETECTED Final   Staphylococcus epidermidis NOT DETECTED NOT DETECTED Final   Staphylococcus lugdunensis NOT DETECTED NOT DETECTED Final   Streptococcus species DETECTED (A)  NOT DETECTED Final    Comment: CRITICAL RESULT CALLED TO, READ BACK BY AND VERIFIED WITH: SHEEMA H. AT 1000 06/09/24 RAM    Streptococcus agalactiae DETECTED (A) NOT DETECTED Final    Comment: CRITICAL RESULT CALLED TO, READ BACK BY AND VERIFIED WITH: SHEEMA H. AT 1000 06/09/24 RAM    Streptococcus pneumoniae NOT DETECTED NOT DETECTED Final   Streptococcus  pyogenes NOT DETECTED NOT DETECTED Final   A.calcoaceticus-baumannii NOT DETECTED NOT DETECTED Final   Bacteroides fragilis NOT DETECTED NOT DETECTED Final   Enterobacterales NOT DETECTED NOT DETECTED Final   Enterobacter cloacae complex NOT DETECTED NOT DETECTED Final   Escherichia coli NOT DETECTED NOT DETECTED Final   Klebsiella aerogenes NOT DETECTED NOT DETECTED Final   Klebsiella oxytoca NOT DETECTED NOT DETECTED Final   Klebsiella pneumoniae NOT DETECTED NOT DETECTED Final   Proteus species NOT DETECTED NOT DETECTED Final   Salmonella species NOT DETECTED NOT DETECTED Final   Serratia marcescens NOT DETECTED NOT DETECTED Final   Haemophilus influenzae NOT DETECTED NOT DETECTED Final   Neisseria meningitidis NOT DETECTED NOT DETECTED Final   Pseudomonas aeruginosa NOT DETECTED NOT DETECTED Final   Stenotrophomonas maltophilia NOT DETECTED NOT DETECTED Final   Candida albicans NOT DETECTED NOT DETECTED Final   Candida auris NOT DETECTED NOT DETECTED Final   Candida glabrata NOT DETECTED NOT DETECTED Final   Candida krusei NOT DETECTED NOT DETECTED Final   Candida parapsilosis NOT DETECTED NOT DETECTED Final   Candida tropicalis NOT DETECTED NOT DETECTED Final   Cryptococcus neoformans/gattii NOT DETECTED NOT DETECTED Final    Comment: Performed at [facility], [address] [place], [place] [address]  Resp panel by RT-PCR (RSV, Flu A&B, Covid) Anterior Nasal Swab     Status: None   Collection Time: 06/08/24 10:21 PM   Specimen: Anterior Nasal Swab  Result Value Ref Range Status   SARS Coronavirus 2 by RT PCR NEGATIVE NEGATIVE Final    Comment: (NOTE) SARS-CoV-2 target nucleic acids are NOT DETECTED.  The SARS-CoV-2 RNA is generally detectable in upper respiratory specimens during the acute phase of infection. The lowest concentration of SARS-CoV-2 viral copies this assay can detect is 138 copies/mL. A negative result does not preclude SARS-Cov-2 infection and  should not be used as the sole basis for treatment or other patient management decisions. A negative result may occur with  improper specimen collection/handling, submission of specimen other than nasopharyngeal swab, presence of viral mutation(s) within the areas targeted by this assay, and inadequate number of viral copies(<138 copies/mL). A negative result must be combined with clinical observations, patient history, and epidemiological information. The expected result is Negative.  Fact Sheet for Patients:  BloggerCourse.com  Fact Sheet for Healthcare Providers:  SeriousBroker.it  This test is no t yet approved or cleared by the [place]  FDA and  has been authorized for detection and/or diagnosis of SARS-CoV-2 by FDA under an Emergency Use Authorization (EUA). This EUA will remain  in effect (meaning this test can be used) for the duration of the COVID-19 declaration under Section 564(b)(1) of the Act, 21 U.S.C.section 360bbb-3(b)(1), unless the authorization is terminated  or revoked sooner.       Influenza A by PCR NEGATIVE NEGATIVE Final   Influenza B by PCR NEGATIVE NEGATIVE Final    Comment: (NOTE) The Xpert Xpress SARS-CoV-2/FLU/RSV plus assay is intended as an aid in the diagnosis of influenza from Nasopharyngeal swab specimens and should not be used as a sole basis for  treatment. Nasal washings and aspirates are unacceptable for Xpert Xpress SARS-CoV-2/FLU/RSV testing.  Fact Sheet for Patients: BloggerCourse.com  Fact Sheet for Healthcare Providers: SeriousBroker.it  This test is not yet approved or cleared by the [place]  FDA and has been authorized for detection and/or diagnosis of SARS-CoV-2 by FDA under an Emergency Use Authorization (EUA). This EUA will remain in effect (meaning this test can be used) for the duration of the COVID-19 declaration  under Section 564(b)(1) of the Act, 21 U.S.C. section 360bbb-3(b)(1), unless the authorization is terminated or revoked.     Resp Syncytial Virus by PCR NEGATIVE NEGATIVE Final    Comment: (NOTE) Fact Sheet for Patients: BloggerCourse.com  Fact Sheet for Healthcare Providers: SeriousBroker.it  This test is not yet approved or cleared by the [place]  FDA and has been authorized for detection and/or diagnosis of SARS-CoV-2 by FDA under an Emergency Use Authorization (EUA). This EUA will remain in effect (meaning this test can be used) for the duration of the COVID-19 declaration under Section 564(b)(1) of the Act, 21 U.S.C. section 360bbb-3(b)(1), unless the authorization is terminated or revoked.  Performed at [facility], [address] [place], [place] [address]   MRSA Next Gen by PCR, Nasal     Status: None   Collection Time: 06/09/24 11:02 AM   Specimen: Nasal Mucosa; Nasal Swab  Result Value Ref Range Status   MRSA by PCR Next Gen NOT DETECTED NOT DETECTED Final    Comment: (NOTE) The GeneXpert MRSA Assay (FDA approved for NASAL specimens only), is one component of a comprehensive MRSA colonization surveillance program. It is not intended to diagnose MRSA infection nor to guide or monitor treatment for MRSA infections. Test performance is not FDA approved in patients less than 106 years old. Performed at [facility], [address] [place], [place] [address]   Culture, blood (Routine X 2) w Reflex to ID Panel     Status: None (Preliminary result)   Collection Time: 06/09/24 10:45 PM   Specimen: BLOOD  Result Value Ref Range Status   Specimen Description BLOOD BLOOD LEFT HAND  Final   Special Requests   Final    BOTTLES DRAWN AEROBIC AND ANAEROBIC Blood Culture results may not be optimal due to an inadequate volume of blood received in culture bottles   Culture   Final    NO GROWTH < 12  HOURS Performed at [facility], [address] [place], [place] [address]    Report Status PENDING  Incomplete  Culture, blood (Routine X 2) w Reflex to ID Panel     Status: None (Preliminary result)   Collection Time: 06/09/24 10:45 PM   Specimen: BLOOD  Result Value Ref Range Status   Specimen Description BLOOD BLOOD LEFT HAND  Final   Special Requests   Final    BOTTLES DRAWN AEROBIC AND ANAEROBIC Blood Culture adequate volume   Culture   Final    NO GROWTH < 12 HOURS Performed at [facility], [address] [place], [place] [address]    Report Status PENDING  Incomplete    Procedures and diagnostic studies:  ECHOCARDIOGRAM COMPLETE Result Date: 06/10/2024    ECHOCARDIOGRAM REPORT   Patient Name:   Wyatt Murray Date of Exam: 06/10/2024 Medical Rec #:  [id number]         Height:       72.0 in Accession #:    [id number]        Weight:  259.9 lb Date of Birth:  1976-02-01         BSA:          2.382 m Patient Age:    48 years          BP:           109/64 mmHg Patient Gender: M                 HR:           95 bpm. Exam Location:  [facility] Procedure: 2D Echo, Cardiac Doppler and Color Doppler (Both Spectral and Color            Flow Doppler were utilized during procedure). Indications:     Bacteremia R78.81  History:         Patient has no prior history of Echocardiogram examinations.  Sonographer:     Thedora Louder RDCS, FASE Referring Phys:  [id number] AIDA CHO Diagnosing Phys: Shelda Bruckner MD IMPRESSIONS  1. Left ventricular ejection fraction, by estimation, is 55 to 60%. The left ventricle has normal function. The left ventricle has no regional wall motion abnormalities. Left ventricular diastolic parameters were normal.  2. Right ventricular systolic function is normal. The right ventricular size is normal. Tricuspid regurgitation signal is inadequate for assessing PA pressure.  3. The mitral valve is normal in structure. Trivial mitral valve  regurgitation. No evidence of mitral stenosis.  4. The aortic valve is tricuspid. Aortic valve regurgitation is not visualized. No aortic stenosis is present.  5. The inferior vena cava is normal in size with greater than 50% respiratory variability, suggesting right atrial pressure of 3 mmHg. Comparison(s): No prior Echocardiogram. Conclusion(s)/Recommendation(s): Normal biventricular function without evidence of hemodynamically significant valvular heart disease. No evidence of valvular vegetations on this transthoracic echocardiogram. Consider a transesophageal echocardiogram to exclude infective endocarditis if clinically indicated. FINDINGS  Left Ventricle: Left ventricular ejection fraction, by estimation, is 55 to 60%. The left ventricle has normal function. The left ventricle has no regional wall motion abnormalities. The left ventricular internal cavity size was normal in size. There is  no left ventricular hypertrophy. Left ventricular diastolic parameters were normal. Right Ventricle: The right ventricular size is normal. No increase in right ventricular wall thickness. Right ventricular systolic function is normal. Tricuspid regurgitation signal is inadequate for assessing PA pressure. Left Atrium: Left atrial size was normal in size. Right Atrium: Right atrial size was normal in size. Pericardium: There is no evidence of pericardial effusion. Mitral Valve: The mitral valve is normal in structure. Trivial mitral valve regurgitation. No evidence of mitral valve stenosis. Tricuspid Valve: The tricuspid valve is grossly normal. Tricuspid valve regurgitation is not demonstrated. No evidence of tricuspid stenosis. Aortic Valve: The aortic valve is tricuspid. Aortic valve regurgitation is not visualized. No aortic stenosis is present. Aortic valve peak gradient measures 6.6 mmHg. Pulmonic Valve: The pulmonic valve was not well visualized. Pulmonic valve regurgitation is not visualized. No evidence of pulmonic  stenosis. Aorta: The aortic root, ascending aorta and aortic arch are all structurally normal, with no evidence of dilitation or obstruction. Venous: The inferior vena cava is normal in size with greater than 50% respiratory variability, suggesting right atrial pressure of 3 mmHg. IAS/Shunts: The atrial septum is grossly normal.  LEFT VENTRICLE PLAX 2D LVIDd:         4.90 cm   Diastology LVIDs:         3.50 cm   LV e' medial:    10.30  cm/s LV PW:         1.30 cm   LV E/e' medial:  10.7 LV IVS:        1.10 cm   LV e' lateral:   12.20 cm/s LVOT diam:     2.30 cm   LV E/e' lateral: 9.0 LV SV:         93 LV SV Index:   39 LVOT Area:     4.15 cm  RIGHT VENTRICLE RV Basal diam:  3.20 cm RV S prime:     16.80 cm/s TAPSE (M-mode): 2.9 cm LEFT ATRIUM             Index        RIGHT ATRIUM           Index LA diam:        4.30 cm 1.81 cm/m   RA Area:     10.90 cm LA Vol (A2C):   28.8 ml 12.09 ml/m  RA Volume:   22.60 ml  9.49 ml/m LA Vol (A4C):   19.4 ml 8.15 ml/m LA Biplane Vol: 24.4 ml 10.25 ml/m  AORTIC VALVE                 PULMONIC VALVE AV Area (Vmax): 3.80 cm     PV Vmax:        1.06 m/s AV Vmax:        128.00 cm/s  PV Peak grad:   4.5 mmHg AV Peak Grad:   6.6 mmHg     RVOT Peak grad: 4 mmHg LVOT Vmax:      117.00 cm/s LVOT Vmean:     80.400 cm/s LVOT VTI:       0.225 m  AORTA Ao Root diam: 3.70 cm Ao Asc diam:  3.10 cm MITRAL VALVE MV Area (PHT): 7.59 cm     SHUNTS MV Decel Time: 100 msec     Systemic VTI:  0.22 m MV E velocity: 110.00 cm/s  Systemic Diam: 2.30 cm MV A velocity: 108.00 cm/s MV E/A ratio:  1.02 Shelda Bruckner MD Electronically signed by Shelda Bruckner MD Signature Date/Time: 06/10/2024/11:49:42 AM    Final    CT ABDOMEN PELVIS WO CONTRAST Result Date: 06/08/2024 CLINICAL DATA:  Sepsis and chronic abdominal pain. EXAM: CT ABDOMEN AND PELVIS WITHOUT CONTRAST TECHNIQUE: Multidetector CT imaging of the abdomen and pelvis was performed following the standard protocol without IV  contrast. RADIATION DOSE REDUCTION: This exam was performed according to the departmental dose-optimization program which includes automated exposure control, adjustment of the mA and/or kV according to patient size and/or use of iterative reconstruction technique. COMPARISON:  CT abdomen 05/09/2023. CT abdomen and pelvis 01/11/2023. FINDINGS: Lower chest: No acute abnormality. Hepatobiliary: No focal liver abnormality is seen. No gallstones, gallbladder wall thickening, or biliary dilatation. Pancreas: Unremarkable. No pancreatic ductal dilatation or surrounding inflammatory changes. Spleen: Normal in size without focal abnormality. Adrenals/Urinary Tract: There is no urinary tract calculus or hydronephrosis. There is mild nonspecific bilateral perinephric fat stranding which is similar to the prior study. Adrenal glands are within normal limits. The bladder is decompressed. Stomach/Bowel: Stomach is within normal limits. No evidence of bowel wall thickening, distention, or inflammatory changes. Small bowel anastomosis is present. Appendix is not seen. Vascular/Lymphatic: Aorta and IVC are normal in size. There are prominent and mildly enlarged periaortic lymph nodes measuring up to 11 mm. These have slightly increased in size from prior. Reproductive: Prostate is unremarkable.  Left hydrocele present. Other: No  abdominal wall hernia or abnormality. No abdominopelvic ascites. Right inguinal hernia repair mesh present. Musculoskeletal: No fracture is seen. IMPRESSION: 1. No acute localizing process in the abdomen or pelvis. 2. Mild nonspecific bilateral perinephric fat stranding, similar to the prior study. 3. Mild retroperitoneal lymphadenopathy, slightly increased in size from prior. Findings may be reactive, but metastatic disease can not be excluded. 4. Left hydrocele. Electronically Signed   By: Greig Pique M.D.   On: 06/08/2024 21:10   CT HEAD WO CONTRAST ( ) Result Date: 06/08/2024 CLINICAL DATA:   Severe headaches for 2 days, initial encounter EXAM: CT HEAD WITHOUT CONTRAST TECHNIQUE: Contiguous axial images were obtained from the base of the skull through the vertex without intravenous contrast. RADIATION DOSE REDUCTION: This exam was performed according to the departmental dose-optimization program which includes automated exposure control, adjustment of the mA and/or kV according to patient size and/or use of iterative reconstruction technique. COMPARISON:  None Available. FINDINGS: Brain: No evidence of acute infarction, hemorrhage, hydrocephalus, extra-axial collection or mass lesion/mass effect. Focal lacunar infarct is noted in the left basal ganglia. Slight asymmetry of the lateral ventricles is noted of uncertain significance. Vascular: No hyperdense vessel or unexpected calcification. Skull: Normal. Negative for fracture or focal lesion. Sinuses/Orbits: No acute finding. Other: None. IMPRESSION: No acute intracranial abnormality noted. Findings consistent with prior lacunar infarct in the left basal ganglia. Electronically Signed   By: Oneil Devonshire M.D.   On: 06/08/2024 21:10   DG Chest 1 View Result Date: 06/08/2024 CLINICAL DATA:  [id number] Fever [id number] EXAM: CHEST  1 VIEW COMPARISON:  04/21/2014 FINDINGS: Lower lung volumes. No focal airspace consolidation, pleural effusion, or pneumothorax. No cardiomegaly.No acute fracture or destructive lesion. IMPRESSION: No acute cardiopulmonary abnormality. Electronically Signed   By: Rogelia Myers M.D.   On: 06/08/2024 17:56               LOS: 2 days   Ayomide Zuleta  [facility]   Pager on [email or web address]. If 7PM-7AM, please contact night-coverage at [email or web address]     06/10/2024, 11:56 AM

## 2024-06-10 NOTE — Plan of Care (Signed)
  Problem: Education: Goal: Knowledge of General Education information will improve Description: Including pain rating scale, medication(s)/side effects and non-pharmacologic comfort measures Outcome: Progressing   Problem: Clinical Measurements: Goal: Ability to maintain clinical measurements within normal limits will improve Outcome: Progressing Goal: Will remain free from infection Outcome: Progressing Goal: Diagnostic test results will improve Outcome: Progressing   Problem: Activity: Goal: Risk for activity intolerance will decrease Outcome: Progressing   

## 2024-06-10 NOTE — Progress Notes (Signed)
  Echocardiogram 2D Echocardiogram has been performed.  Wyatt Murray Louder 06/10/2024, 9:41 AM

## 2024-06-11 ENCOUNTER — Other Ambulatory Visit: Payer: Self-pay

## 2024-06-11 DIAGNOSIS — I890 Lymphedema, not elsewhere classified: Secondary | ICD-10-CM

## 2024-06-11 DIAGNOSIS — L539 Erythematous condition, unspecified: Secondary | ICD-10-CM

## 2024-06-11 DIAGNOSIS — R234 Changes in skin texture: Secondary | ICD-10-CM

## 2024-06-11 DIAGNOSIS — B951 Streptococcus, group B, as the cause of diseases classified elsewhere: Secondary | ICD-10-CM

## 2024-06-11 LAB — CULTURE, BLOOD (SINGLE)

## 2024-06-11 MED ORDER — AMOXICILLIN 500 MG PO CAPS
1000.0000 mg | ORAL_CAPSULE | Freq: Three times a day (TID) | ORAL | 0 refills | Status: AC
Start: 2024-06-11 — End: 2024-06-19
  Filled 2024-06-11: qty 48, 8d supply, fill #0

## 2024-06-11 MED ORDER — DICYCLOMINE HCL 10 MG PO CAPS: 10.0000 mg | ORAL_CAPSULE | Freq: Three times a day (TID) | ORAL | Status: AC | PRN

## 2024-06-11 MED ORDER — METRONIDAZOLE 500 MG PO TABS: 500.0000 mg | ORAL_TABLET | Freq: Two times a day (BID) | ORAL | Status: DC

## 2024-06-11 NOTE — Discharge Summary (Signed)
 Physician Discharge Summary   Patient: Wyatt Murray MRN: [id number] DOB: [birth year]  Admit date:     06/08/2024  Discharge date: 06/11/24  Discharge Physician: AIDA CHO   PCP: Joshua Debby CROME, MD   Recommendations at discharge:  {Tip this will not be part of the note when signed- Example include specific recommendations for outpatient follow-up, pending tests to follow-up on. (Optional):26781} Follow-up with Dr. Fayette, ID specialist, on 06/26/2024  Discharge Diagnoses: Principal Problem:   Streptococcal bacteremia Active Problems:   Headache   Acute metabolic encephalopathy  Resolved Problems:   * No resolved hospital problems. *  Hospital Course:  Wyatt Murray is a 48 y.o. 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  He presented to the hospital because of fever, chills, sweating, and altered mental status/confusion.     Vitals in the ED temperature 100.3 which went up to 100.6, respiratory rate 18, heart rate 117, O2 sat 91% on room air, BP 113/78   Labs significant for WBC 28.3, AST 65, ALT 55   LP was attempted in the ED but patient did not tolerate procedure so LP was aborted.    Assessment and Plan:   Fever, headache, chills, confusion, sepsis secondary to Streptococcus agalactiae bacteremia: Blood cultures positive for streptococcal UTI (1 out of 4 bottles).  No growth thus far on repeat blood cultures from 06/09/2024. 2D echo showed normal EF, no valvular heart disease and no evidence of vegetations or endocarditis. Patient had declined LP after previous unsuccessful attempt Consulted Dr. Fayette, ID specialist, to assist with management. Case was discussed with her and she recommended amoxicillin  1 g 3 times daily for 8  days. Of note, patient said that he was going to leave AGAINST MEDICAL ADVICE if he was not discharged today and he also said he was not going to do any TEE even if it was recommended.   Acute metabolic encephalopathy: Resolved     Hyponatremia: Improved Hypokalemia: Improved.  Continue potassium repletion at discharge     Chronic peripheral edema: He takes torsemide  for peripheral edema.  He said he does not know why he has chronic swelling of the legs.  No prior heart disease, liver or kidney disease.   BNP was 106.2 2D echo was unremarkable.       Chronic abdominal pain from multiple surgeries, IBS, chronic pain syndrome: Continue Suboxone   He is deemed medically stable for discharge to home today.     {Tip this will not be part of the note when signed Body mass index is 35.25 kg/m. , ,  (Optional):26781}   Consultants: ID specialist Procedures performed: None Disposition: Home Diet recommendation:  Discharge Diet Orders (From admission, onward)     Start     Ordered   06/11/24 0000  Diet - low sodium heart healthy        06/11/24 1349           Cardiac diet DISCHARGE MEDICATION: Allergies as of 06/11/2024       Reactions   Iodinated Contrast Media Shortness Of Breath, Rash   IV dye   Iodine Anaphylaxis   Midazolam Other (See Comments)   VERY ANGRY, AGITATED, TAKES OUT IV,   Shellfish Allergy Anaphylaxis   Atorvastatin  Other (See Comments)   Elevated liver enzymes  Medication List     STOP taking these medications    predniSONE  50 MG tablet Commonly known as: DELTASONE        TAKE these medications    amoxicillin  500 MG capsule Commonly known as: AMOXIL  Take 2 capsules (1,000 mg total) by mouth 3 (three) times daily for 8 days.   atorvastatin  20 MG tablet Commonly known as: LIPITOR TAKE 1 TABLET(20 MG) BY MOUTH DAILY   buprenorphine  8 MG Subl SL tablet Commonly known as: SUBUTEX  Place 8 mg under the tongue 2 (two) times  daily.   diazepam  5 MG tablet Commonly known as: VALIUM  Take 5 mg by mouth daily as needed.   dicyclomine  10 MG capsule Commonly known as: BENTYL  Take 1 capsule (10 mg total) by mouth 3 (three) times daily as needed (Stomach pain).   EPINEPHrine  0.3 mg/0.3 mL Soaj injection Commonly known as: EpiPen  2-Pak Inject 0.3 mg into the muscle as needed for anaphylaxis.   ergocalciferol 1.25 MG (50000 UT) capsule Commonly known as: VITAMIN D2 Take 50,000 Units by mouth once a week.   lidocaine  5 % Commonly known as: LIDODERM  Place 1 patch onto the skin daily as needed (pain). Remove & Discard patch within 12 hours or as directed by MD   metoprolol  succinate 50 MG 24 hr tablet Commonly known as: TOPROL -XL Take 1 tablet (50 mg total) by mouth daily. Take with or immediately following a meal.   Omega 3 1000 MG Caps Take 1,000 mg by mouth daily.   potassium chloride  SA 15 MEQ tablet Commonly known as: KLOR-CON  M15 Take 1 tablet (15 mEq total) by mouth 2 (two) times daily.   torsemide  10 MG tablet Commonly known as: DEMADEX  Take 1 tablet (10 mg total) by mouth daily.        Follow-up Information     Fayette Bodily, MD Follow up on 06/26/2024.   Specialty: Infectious Diseases Contact information: [address] [place] [address] [phone]                Discharge Exam: Wyatt Murray   06/08/24 1727 06/09/24 0000  Weight: 99.8 kg 117.9 kg   GEN: NAD SKIN: Warm and dry.  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  No erythema or tenderness.   Condition at discharge: good  The results of significant diagnostics from this hospitalization (including imaging,  microbiology, ancillary and laboratory) are listed below for reference.   Imaging Studies: ECHOCARDIOGRAM COMPLETE Result Date: 06/10/2024    ECHOCARDIOGRAM REPORT   Patient Name:   Wyatt Murray Date of Exam: 06/10/2024 Medical Rec #:  [id number]         Height:       72.0 in Accession #:    [id number]        Weight:       259.9 lb Date of Birth:  [birth year]         BSA:          2.382 m Patient Age:    48 years          BP:           109/64 mmHg Patient Gender:  M                 HR:           95 bpm. Exam Location:  [facility] Procedure: 2D Echo, Cardiac Doppler and Color Doppler (Both Spectral and Color            Flow Doppler were utilized during procedure). Indications:     Bacteremia R78.81  History:         Patient has no prior history of Echocardiogram examinations.  Sonographer:     Thedora Louder RDCS, FASE Referring Phys:  [id number] AIDA CHO Diagnosing Phys: Shelda Bruckner MD IMPRESSIONS  1. Left ventricular ejection fraction, by estimation, is 55 to 60%. The left ventricle has normal function. The left ventricle has no regional wall motion abnormalities. Left ventricular diastolic parameters were normal.  2. Right ventricular systolic function is normal. The right ventricular size is normal. Tricuspid regurgitation signal is inadequate for assessing PA pressure.  3. The mitral valve is normal in structure. Trivial mitral valve regurgitation. No evidence of mitral stenosis.  4. The aortic valve is tricuspid. Aortic valve regurgitation is not visualized. No aortic stenosis is present.  5. The inferior vena cava is normal in size with greater than 50% respiratory variability, suggesting right atrial pressure of 3 mmHg. Comparison(s): No prior Echocardiogram. Conclusion(s)/Recommendation(s): Normal biventricular function without evidence of hemodynamically significant valvular heart disease. No evidence of valvular vegetations on this transthoracic echocardiogram. Consider a transesophageal  echocardiogram to exclude infective endocarditis if clinically indicated. FINDINGS  Left Ventricle: Left ventricular ejection fraction, by estimation, is 55 to 60%. The left ventricle has normal function. The left ventricle has no regional wall motion abnormalities. The left ventricular internal cavity size was normal in size. There is  no left ventricular hypertrophy. Left ventricular diastolic parameters were normal. Right Ventricle: The right ventricular size is normal. No increase in right ventricular wall thickness. Right ventricular systolic function is normal. Tricuspid regurgitation signal is inadequate for assessing PA pressure. Left Atrium: Left atrial size was normal in size. Right Atrium: Right atrial size was normal in size. Pericardium: There is no evidence of pericardial effusion. Mitral Valve: The mitral valve is normal in structure. Trivial mitral valve regurgitation. No evidence of mitral valve stenosis. Tricuspid Valve: The tricuspid valve is grossly normal. Tricuspid valve regurgitation is not demonstrated. No evidence of tricuspid stenosis. Aortic Valve: The aortic valve is tricuspid. Aortic valve regurgitation is not visualized. No aortic stenosis is present. Aortic valve peak gradient measures 6.6 mmHg. Pulmonic Valve: The pulmonic valve was not well visualized. Pulmonic valve regurgitation is not visualized. No evidence of pulmonic stenosis. Aorta: The aortic root, ascending aorta and aortic arch are all structurally normal, with no evidence of dilitation or obstruction. Venous: The inferior vena cava is normal in size with greater than 50% respiratory variability, suggesting right atrial pressure of 3 mmHg. IAS/Shunts: The atrial septum is grossly normal.  LEFT VENTRICLE PLAX 2D LVIDd:         4.90 cm   Diastology LVIDs:         3.50 cm   LV e' medial:    10.30 cm/s LV PW:         1.30 cm   LV E/e' medial:  10.7 LV IVS:        1.10 cm   LV e' lateral:   12.20 cm/s LVOT diam:     2.30 cm   LV  E/e' lateral: 9.0 LV SV:  93 LV SV Index:   39 LVOT Area:     4.15 cm  RIGHT VENTRICLE RV Basal diam:  3.20 cm RV S prime:     16.80 cm/s TAPSE (M-mode): 2.9 cm LEFT ATRIUM             Index        RIGHT ATRIUM           Index LA diam:        4.30 cm 1.81 cm/m   RA Area:     10.90 cm LA Vol (A2C):   28.8 ml 12.09 ml/m  RA Volume:   22.60 ml  9.49 ml/m LA Vol (A4C):   19.4 ml 8.15 ml/m LA Biplane Vol: 24.4 ml 10.25 ml/m  AORTIC VALVE                 PULMONIC VALVE AV Area (Vmax): 3.80 cm     PV Vmax:        1.06 m/s AV Vmax:        128.00 cm/s  PV Peak grad:   4.5 mmHg AV Peak Grad:   6.6 mmHg     RVOT Peak grad: 4 mmHg LVOT Vmax:      117.00 cm/s LVOT Vmean:     80.400 cm/s LVOT VTI:       0.225 m  AORTA Ao Root diam: 3.70 cm Ao Asc diam:  3.10 cm MITRAL VALVE MV Area (PHT): 7.59 cm     SHUNTS MV Decel Time: 100 msec     Systemic VTI:  0.22 m MV E velocity: 110.00 cm/s  Systemic Diam: 2.30 cm MV A velocity: 108.00 cm/s MV E/A ratio:  1.02 Shelda Bruckner MD Electronically signed by Shelda Bruckner MD Signature Date/Time: 06/10/2024/11:49:42 AM    Final    CT ABDOMEN PELVIS WO CONTRAST Result Date: 06/08/2024 CLINICAL DATA:  Sepsis and chronic abdominal pain. EXAM: CT ABDOMEN AND PELVIS WITHOUT CONTRAST TECHNIQUE: Multidetector CT imaging of the abdomen and pelvis was performed following the standard protocol without IV contrast. RADIATION DOSE REDUCTION: This exam was performed according to the departmental dose-optimization program which includes automated exposure control, adjustment of the mA and/or kV according to patient size and/or use of iterative reconstruction technique. COMPARISON:  CT abdomen 05/09/2023. CT abdomen and pelvis 01/11/2023. FINDINGS: Lower chest: No acute abnormality. Hepatobiliary: No focal liver abnormality is seen. No gallstones, gallbladder wall thickening, or biliary dilatation. Pancreas: Unremarkable. No pancreatic ductal dilatation or surrounding  inflammatory changes. Spleen: Normal in size without focal abnormality. Adrenals/Urinary Tract: There is no urinary tract calculus or hydronephrosis. There is mild nonspecific bilateral perinephric fat stranding which is similar to the prior study. Adrenal glands are within normal limits. The bladder is decompressed. Stomach/Bowel: Stomach is within normal limits. No evidence of bowel wall thickening, distention, or inflammatory changes. Small bowel anastomosis is present. Appendix is not seen. Vascular/Lymphatic: Aorta and IVC are normal in size. There are prominent and mildly enlarged periaortic lymph nodes measuring up to 11 mm. These have slightly increased in size from prior. Reproductive: Prostate is unremarkable.  Left hydrocele present. Other: No abdominal wall hernia or abnormality. No abdominopelvic ascites. Right inguinal hernia repair mesh present. Musculoskeletal: No fracture is seen. IMPRESSION: 1. No acute localizing process in the abdomen or pelvis. 2. Mild nonspecific bilateral perinephric fat stranding, similar to the prior study. 3. Mild retroperitoneal lymphadenopathy, slightly increased in size from prior. Findings may be reactive, but metastatic disease can not be excluded. 4.  Left hydrocele. Electronically Signed   By: Greig Pique M.D.   On: 06/08/2024 21:10   CT HEAD WO CONTRAST ( ) Result Date: 06/08/2024 CLINICAL DATA:  Severe headaches for 2 days, initial encounter EXAM: CT HEAD WITHOUT CONTRAST TECHNIQUE: Contiguous axial images were obtained from the base of the skull through the vertex without intravenous contrast. RADIATION DOSE REDUCTION: This exam was performed according to the departmental dose-optimization program which includes automated exposure control, adjustment of the mA and/or kV according to patient size and/or use of iterative reconstruction technique. COMPARISON:  None Available. FINDINGS: Brain: No evidence of acute infarction, hemorrhage, hydrocephalus,  extra-axial collection or mass lesion/mass effect. Focal lacunar infarct is noted in the left basal ganglia. Slight asymmetry of the lateral ventricles is noted of uncertain significance. Vascular: No hyperdense vessel or unexpected calcification. Skull: Normal. Negative for fracture or focal lesion. Sinuses/Orbits: No acute finding. Other: None. IMPRESSION: No acute intracranial abnormality noted. Findings consistent with prior lacunar infarct in the left basal ganglia. Electronically Signed   By: Oneil Devonshire M.D.   On: 06/08/2024 21:10   DG Chest 1 View Result Date: 06/08/2024 CLINICAL DATA:  [id number] Fever [id number] EXAM: CHEST  1 VIEW COMPARISON:  04/21/2014 FINDINGS: Lower lung volumes. No focal airspace consolidation, pleural effusion, or pneumothorax. No cardiomegaly.No acute fracture or destructive lesion. IMPRESSION: No acute cardiopulmonary abnormality. Electronically Signed   By: Rogelia Myers M.D.   On: 06/08/2024 17:56    Microbiology: Results for orders placed or performed during the hospital encounter of 06/08/24  Blood culture (single)     Status: None (Preliminary result)   Collection Time: 06/08/24  5:50 PM   Specimen: BLOOD  Result Value Ref Range Status   Specimen Description BLOOD BLOOD RIGHT WRIST  Final   Special Requests   Final    BOTTLES DRAWN AEROBIC AND ANAEROBIC Blood Culture adequate volume   Culture   Final    NO GROWTH 3 DAYS Performed at [facility], [address] [place], [place] [address]    Report Status PENDING  Incomplete  Blood culture (single)     Status: Abnormal   Collection Time: 06/08/24  7:01 PM   Specimen: BLOOD  Result Value Ref Range Status   Specimen Description   Final    BLOOD RIGHT ANTECUBITAL Performed at [facility], [address], [place], [place] [address]    Special Requests   Final    BOTTLES DRAWN AEROBIC AND ANAEROBIC Blood Culture results may not be optimal due to an inadequate volume of blood received in  culture bottles Performed at [facility], [address] [place], [place] [address]    Culture  Setup Time   Final    GRAM POSITIVE COCCI IN BOTH AEROBIC AND ANAEROBIC BOTTLES CRITICAL RESULT CALLED TO, READ BACK BY AND VERIFIED WITH:  SHEEMA H. AT 1000 06/09/24 RAM Performed at [facility], [address], [place], [place] [address]    Culture GROUP B STREP(S.AGALACTIAE)ISOLATED (A)  Final   Report Status 06/11/2024 FINAL  Final   Organism ID, Bacteria GROUP B STREP(S.AGALACTIAE)ISOLATED  Final      Susceptibility   Group b strep(s.agalactiae)isolated - MIC*    CLINDAMYCIN >=1 RESISTANT Resistant     AMPICILLIN <=0.25 SENSITIVE Sensitive     ERYTHROMYCIN >=8 RESISTANT Resistant     VANCOMYCIN  0.5 SENSITIVE Sensitive     CEFTRIAXONE  <=0.12 SENSITIVE Sensitive     LEVOFLOXACIN 1 SENSITIVE Sensitive     PENICILLIN <=  0.06 SENSITIVE Sensitive     * GROUP B STREP(S.AGALACTIAE)ISOLATED  Blood Culture ID Panel (Reflexed)     Status: Abnormal   Collection Time: 06/08/24  7:01 PM  Result Value Ref Range Status   Enterococcus faecalis NOT DETECTED NOT DETECTED Final   Enterococcus Faecium NOT DETECTED NOT DETECTED Final   Listeria monocytogenes NOT DETECTED NOT DETECTED Final   Staphylococcus species NOT DETECTED NOT DETECTED Final   Staphylococcus aureus (BCID) NOT DETECTED NOT DETECTED Final   Staphylococcus epidermidis NOT DETECTED NOT DETECTED Final   Staphylococcus lugdunensis NOT DETECTED NOT DETECTED Final   Streptococcus species DETECTED (A) NOT DETECTED Final    Comment: CRITICAL RESULT CALLED TO, READ BACK BY AND VERIFIED WITH: SHEEMA H. AT 1000 06/09/24 RAM    Streptococcus agalactiae DETECTED (A) NOT DETECTED Final    Comment: CRITICAL RESULT CALLED TO, READ BACK BY AND VERIFIED WITH: SHEEMA H. AT 1000 06/09/24 RAM    Streptococcus pneumoniae NOT DETECTED NOT DETECTED Final   Streptococcus pyogenes NOT DETECTED NOT DETECTED Final   A.calcoaceticus-baumannii  NOT DETECTED NOT DETECTED Final   Bacteroides fragilis NOT DETECTED NOT DETECTED Final   Enterobacterales NOT DETECTED NOT DETECTED Final   Enterobacter cloacae complex NOT DETECTED NOT DETECTED Final   Escherichia coli NOT DETECTED NOT DETECTED Final   Klebsiella aerogenes NOT DETECTED NOT DETECTED Final   Klebsiella oxytoca NOT DETECTED NOT DETECTED Final   Klebsiella pneumoniae NOT DETECTED NOT DETECTED Final   Proteus species NOT DETECTED NOT DETECTED Final   Salmonella species NOT DETECTED NOT DETECTED Final   Serratia marcescens NOT DETECTED NOT DETECTED Final   Haemophilus influenzae NOT DETECTED NOT DETECTED Final   Neisseria meningitidis NOT DETECTED NOT DETECTED Final   Pseudomonas aeruginosa NOT DETECTED NOT DETECTED Final   Stenotrophomonas maltophilia NOT DETECTED NOT DETECTED Final   Candida albicans NOT DETECTED NOT DETECTED Final   Candida auris NOT DETECTED NOT DETECTED Final   Candida glabrata NOT DETECTED NOT DETECTED Final   Candida krusei NOT DETECTED NOT DETECTED Final   Candida parapsilosis NOT DETECTED NOT DETECTED Final   Candida tropicalis NOT DETECTED NOT DETECTED Final   Cryptococcus neoformans/gattii NOT DETECTED NOT DETECTED Final    Comment: Performed at [facility], [address] [place], [place] [address]  Resp panel by RT-PCR (RSV, Flu A&B, Covid) Anterior Nasal Swab     Status: None   Collection Time: 06/08/24 10:21 PM   Specimen: Anterior Nasal Swab  Result Value Ref Range Status   SARS Coronavirus 2 by RT PCR NEGATIVE NEGATIVE Final    Comment: (NOTE) SARS-CoV-2 target nucleic acids are NOT DETECTED.  The SARS-CoV-2 RNA is generally detectable in upper respiratory specimens during the acute phase of infection. The lowest concentration of SARS-CoV-2 viral copies this assay can detect is 138 copies/mL. A negative result does not preclude SARS-Cov-2 infection and should not be used as the sole basis for treatment or other patient  management decisions. A negative result may occur with  improper specimen collection/handling, submission of specimen other than nasopharyngeal swab, presence of viral mutation(s) within the areas targeted by this assay, and inadequate number of viral copies(<138 copies/mL). A negative result must be combined with clinical observations, patient history, and epidemiological information. The expected result is Negative.  Fact Sheet for Patients:  BloggerCourse.com  Fact Sheet for Healthcare Providers:  SeriousBroker.it  This test is no t yet approved or cleared by the [place]  FDA and  has been authorized for detection  and/or diagnosis of SARS-CoV-2 by FDA under an Emergency Use Authorization (EUA). This EUA will remain  in effect (meaning this test can be used) for the duration of the COVID-19 declaration under Section 564(b)(1) of the Act, 21 U.S.C.section 360bbb-3(b)(1), unless the authorization is terminated  or revoked sooner.       Influenza A by PCR NEGATIVE NEGATIVE Final   Influenza B by PCR NEGATIVE NEGATIVE Final    Comment: (NOTE) The Xpert Xpress SARS-CoV-2/FLU/RSV plus assay is intended as an aid in the diagnosis of influenza from Nasopharyngeal swab specimens and should not be used as a sole basis for treatment. Nasal washings and aspirates are unacceptable for Xpert Xpress SARS-CoV-2/FLU/RSV testing.  Fact Sheet for Patients: BloggerCourse.com  Fact Sheet for Healthcare Providers: SeriousBroker.it  This test is not yet approved or cleared by the [place]  FDA and has been authorized for detection and/or diagnosis of SARS-CoV-2 by FDA under an Emergency Use Authorization (EUA). This EUA will remain in effect (meaning this test can be used) for the duration of the COVID-19 declaration under Section 564(b)(1) of the Act, 21 U.S.C. section 360bbb-3(b)(1),  unless the authorization is terminated or revoked.     Resp Syncytial Virus by PCR NEGATIVE NEGATIVE Final    Comment: (NOTE) Fact Sheet for Patients: BloggerCourse.com  Fact Sheet for Healthcare Providers: SeriousBroker.it  This test is not yet approved or cleared by the [place]  FDA and has been authorized for detection and/or diagnosis of SARS-CoV-2 by FDA under an Emergency Use Authorization (EUA). This EUA will remain in effect (meaning this test can be used) for the duration of the COVID-19 declaration under Section 564(b)(1) of the Act, 21 U.S.C. section 360bbb-3(b)(1), unless the authorization is terminated or revoked.  Performed at [facility], [address] [place], [place] [address]   MRSA Next Gen by PCR, Nasal     Status: None   Collection Time: 06/09/24 11:02 AM   Specimen: Nasal Mucosa; Nasal Swab  Result Value Ref Range Status   MRSA by PCR Next Gen NOT DETECTED NOT DETECTED Final    Comment: (NOTE) The GeneXpert MRSA Assay (FDA approved for NASAL specimens only), is one component of a comprehensive MRSA colonization surveillance program. It is not intended to diagnose MRSA infection nor to guide or monitor treatment for MRSA infections. Test performance is not FDA approved in patients less than 68 years old. Performed at [facility], [address] [place], [place] [address]   Culture, blood (Routine X 2) w Reflex to ID Panel     Status: None (Preliminary result)   Collection Time: 06/09/24 10:45 PM   Specimen: BLOOD  Result Value Ref Range Status   Specimen Description BLOOD BLOOD LEFT HAND  Final   Special Requests   Final    BOTTLES DRAWN AEROBIC AND ANAEROBIC Blood Culture results may not be optimal due to an inadequate volume of blood received in culture bottles   Culture   Final    NO GROWTH 2 DAYS Performed at [facility], [address], [place], [place]  [address]    Report Status PENDING  Incomplete  Culture, blood (Routine X 2) w Reflex to ID Panel     Status: None (Preliminary result)   Collection Time: 06/09/24 10:45 PM   Specimen: BLOOD  Result Value Ref Range Status   Specimen Description BLOOD BLOOD LEFT HAND  Final   Special Requests   Final    BOTTLES DRAWN AEROBIC AND ANAEROBIC Blood  Culture adequate volume   Culture   Final    NO GROWTH 2 DAYS Performed at [facility], [address] [place], [place] [address]    Report Status PENDING  Incomplete    Labs: CBC: Recent Labs  Lab 06/08/24 1749 06/09/24 0627 06/10/24 1029  WBC 28.3* 19.5* 8.3  NEUTROABS 25.4*  --   --   HGB 13.3 11.3* 12.1*  HCT 39.4 34.0* 36.2*  MCV 82.1 82.9 84.0  PLT 259 240 235   Basic Metabolic Panel: Recent Labs  Lab 06/08/24 1749 06/09/24 0627 06/10/24 1029  NA 137 133* 136  K 3.9 3.3* 3.5  CL 99 100 101  CO2 25 23 26   GLUCOSE 127* 143* 120*  BUN 18 17 14   CREATININE 1.03 1.00 0.90  CALCIUM  8.9 8.0* 8.2*  MG  --   --  2.2   Liver Function Tests: Recent Labs  Lab 06/08/24 1749 06/09/24 0627  AST 65* 52*  ALT 55* 42  ALKPHOS 69 50  BILITOT 0.8 0.7  PROT 6.9 6.1*  ALBUMIN 3.0* 2.7*   CBG: No results for input(s): GLUCAP in the last 168 hours.  Discharge time spent: greater than 30 minutes.  Signed: AIDA CHO, MD [facility] 06/11/2024

## 2024-06-11 NOTE — Consult Note (Addendum)
 NAME: Wyatt Murray  DOB: [birth year]  MRN: [id number]  Date/Time: 06/11/2024 12:59 PM  REQUESTING PROVIDER: Blanchie Subjective:  REASON FOR CONSULT: strep bacteremia ? Wyatt Murray is a 48 y.o. 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 He was taking his daughter to [place] but felt ill and was brought to the ED BY his wife on 06/08/24 He was confused as per daughter and wife  06/08/24 17:26  BP 113/78  Temp 100.3 F (37.9 C)  Pulse Rate 117 !  Resp 18  SpO2 91 %    Latest Reference Range & Units 06/08/24 17:49  WBC 4.0 - 10.5 K/uL 28.3 (H)  Hemoglobin 13.0 - 17.0 g/dL 86.6  HCT 60.9 - 47.9 % 39.4  Platelets 150 - 400 K/uL 259  Creatinine 0.61 - 1.24 mg/dL 8.96   Blood culture sent and was started on Iv antibiotics LP attempted but not successful and patient later did not want any repeat LP. He initially was on antibiotics for possible meningitis. Then Blood culture came positive for Group B streptococcus and on call ID consulted over the weekend, and all antibiotics Dc and he was started on cefazolin  I am seeing him for the same He is doing so much better Both legs have lymphedema but it was worse before admission. Also the rt heel has dry skin and cracked skin   Past Medical History:  Diagnosis Date   Anxiety    Blood in stool    GERD (gastroesophageal reflux disease)    Hyperlipidemia    Hypertension    Pre-diabetes    Substance abuse (HCC)    pain meds    Past Surgical History:  Procedure Laterality Date   APPENDECTOMY     BOWEL RESECTION     HERNIA REPAIR     Inguinal x 10 surgeries with strangulated bowel resected   RADIOLOGY WITH ANESTHESIA Right 01/14/2023   Procedure: Right renal cryoablation;  Surgeon: Johann Sieving, MD;  Location: [facility];  Service: Radiology;  Laterality: Right;    Social History   Socioeconomic  History   Marital status: Divorced    Spouse name: Not on file   Number of children: Not on file   Years of education: Not on file   Highest education level: Not on file  Occupational History   Not on file  Tobacco Use   Smoking status: Former    Current packs/day: 0.00    Types: Cigarettes    Quit date: 11/02/2021    Years since quitting: 2.6    Passive exposure: Current   Smokeless tobacco: Current   Tobacco comments:    Uses zyn  Vaping Use   Vaping status: Every Day   Substances: Nicotine   Substance and Sexual Activity   Alcohol use: Not Currently    Comment: 01/12/23   Drug use: Not Currently   Sexual activity: Yes    Partners: Female  Other Topics Concern   Not on file  Social History Narrative   Lives with fiance.  One child age 52.     Social Drivers of Corporate investment banker Strain: Low Risk  (10/06/2018)   Received from [facility]   Overall Financial Resource Strain (CARDIA)    Difficulty of Paying Living Expenses: Not hard at all  Food Insecurity: No Food Insecurity (06/09/2024)   Hunger Vital Sign  Worried About Programme researcher, broadcasting/film/video in the Last Year: Never true    Ran Out of Food in the Last Year: Never true  Transportation Needs: No Transportation Needs (06/09/2024)   PRAPARE - Administrator, Civil Service (Medical): No    Lack of Transportation (Non-Medical): No  Physical Activity: Inactive (10/06/2018)   Received from [facility]   Exercise Vital Sign    Days of Exercise per Week: 0 days    Minutes of Exercise per Session: 0 min  Stress: No Stress Concern Present (10/06/2018)   Received from [facility] of Occupational Health - Occupational Stress Questionnaire    Feeling of Stress : Not at all  Social Connections: Unknown (10/06/2018)   Received from [facility]   Social Connection and Isolation Panel    Frequency of Communication with Friends and Family: Three times a week    Frequency of Social  Gatherings with Friends and Family: Twice a week    Attends Religious Services: Patient declined    Database administrator or Organizations: No    Attends Engineer, structural: Not asked    Marital Status: Separated  Intimate Partner Violence: Not At Risk (06/09/2024)   Humiliation, Afraid, Rape, and Kick questionnaire    Fear of Current or Ex-Partner: No    Emotionally Abused: No    Physically Abused: No    Sexually Abused: No    Family History  Problem Relation Age of Onset   Stroke Mother    Hypertension Mother    Hyperlipidemia Mother    Heart disease Mother        CABG   Diabetes Mother    Arthritis Mother    Cancer Father        throat   Arthritis Father    Alcohol abuse Father    Hypertension Paternal Grandfather    Hyperlipidemia Paternal Grandfather    Cancer Paternal Grandfather        pituatary, throat   Alcohol abuse Paternal Grandfather    Allergies  Allergen Reactions   Iodinated Contrast Media Shortness Of Breath and Rash    IV dye   Iodine Anaphylaxis   Midazolam Other (See Comments)    VERY ANGRY, AGITATED, TAKES OUT IV,   Shellfish Allergy Anaphylaxis   Atorvastatin  Other (See Comments)    Elevated liver enzymes   I? Current Facility-Administered Medications  Medication Dose Route Frequency Provider Last Rate Last Admin   albuterol  (PROVENTIL ) (2.5 MG/3ML) 0.083% nebulizer solution 2.5 mg  2.5 mg Nebulization Q2H PRN Acheampong, Peter K, MD       atorvastatin  (LIPITOR) tablet 20 mg  20 mg Oral Daily Acheampong, Peter K, MD   20 mg at 06/11/24 0950   bisacodyl  (DULCOLAX) EC tablet 5 mg  5 mg Oral Daily PRN Acheampong, Peter K, MD       buprenorphine  (SUBUTEX ) sublingual tablet 8 mg  8 mg Sublingual BID Acheampong, Peter K, MD   8 mg at 06/11/24 0948   ceFAZolin  (ANCEF ) IVPB 2g/100 mL premix  2 g Intravenous Q8H Luiz Channel, MD 200 mL/hr at 06/11/24 1249 2 g at 06/11/24 1249   diazepam  (VALIUM ) tablet 5 mg  5 mg Oral QHS Jens Durand, MD    5 mg at 06/10/24 2058   dicyclomine  (BENTYL ) capsule 10 mg  10 mg Oral TID PRN Acheampong, Peter K, MD       enoxaparin  (LOVENOX ) injection 40 mg  40 mg Subcutaneous Q24H Acheampong, Peter K, MD   40 mg at 06/10/24 2058   EPINEPHrine  (EPI-PEN) injection 0.3 mg  0.3 mg Intramuscular PRN Acheampong, Maude POUR, MD       hydrALAZINE  (APRESOLINE ) injection 5 mg  5 mg Intravenous Q6H PRN Acheampong, Peter K, MD       metoprolol  succinate (TOPROL -XL) 24 hr tablet 50 mg  50 mg Oral Daily Acheampong, Peter K, MD   50 mg at 06/11/24 9050   nicotine  (NICODERM CQ  - dosed in mg/24 hours) patch 21 mg  21 mg Transdermal Daily Claudene Rover, MD   21 mg at 06/08/24 2124   ondansetron  (ZOFRAN ) tablet 4 mg  4 mg Oral Q6H PRN Acheampong, Maude POUR, MD       Or   ondansetron  (ZOFRAN ) injection 4 mg  4 mg Intravenous Q6H PRN Acheampong, Maude POUR, MD       potassium chloride  (KLOR-CON  M) CR tablet 15 mEq  15 mEq Oral Daily Jens Durand, MD   15 mEq at 06/11/24 0949   torsemide  (DEMADEX ) tablet 10 mg  10 mg Oral BID Jens Durand, MD   10 mg at 06/10/24 1934     Abtx:  Anti-infectives (From admission, onward)    Start     Dose/Rate Route Frequency Ordered Stop   06/09/24 1400  ceFAZolin  (ANCEF ) IVPB 2g/100 mL premix        2 g 200 mL/hr over 30 Minutes Intravenous Every 8 hours 06/09/24 1118     06/09/24 0900  vancomycin  (VANCOREADY) IVPB 1250 mg/250 mL  Status:  Discontinued        1,250 mg 166.7 mL/hr over 90 Minutes Intravenous Every 12 hours 06/09/24 0141 06/09/24 1118   06/09/24 0230  vancomycin  (VANCOREADY) IVPB 1250 mg/250 mL  Status:  Discontinued        1,250 mg 166.7 mL/hr over 90 Minutes Intravenous Every 12 hours 06/09/24 0136 06/09/24 0141   06/09/24 0000  vancomycin  (VANCOCIN ) IVPB 1000 mg/200 mL premix  Status:  Discontinued        1,000 mg 200 mL/hr over 60 Minutes Intravenous Every 24 hours 06/08/24 2224 06/08/24 2304   06/08/24 2300  vancomycin  (VANCOREADY) IVPB 1500 mg/300 mL        1,500  mg 150 mL/hr over 120 Minutes Intravenous  Once 06/08/24 2253 06/09/24 0800   06/08/24 2000  acyclovir  (ZOVIRAX ) 1,000 mg in dextrose  5 % 250 mL IVPB        10 mg/kg  99.8 kg 270 mL/hr over 60 Minutes Intravenous  Once 06/08/24 1949 06/09/24 0700   06/08/24 1930  vancomycin  (VANCOCIN ) IVPB 1000 mg/200 mL premix        1,000 mg 200 mL/hr over 60 Minutes Intravenous  Once 06/08/24 1927 06/08/24 2356   06/08/24 1930  cefTRIAXone  (ROCEPHIN ) 2 g in sodium chloride  0.9 % 100 mL IVPB  Status:  Discontinued        2 g 200 mL/hr over 30 Minutes Intravenous Every 12 hours 06/08/24 1927 06/09/24 1118       REVIEW OF SYSTEMS:  Const:  fever,  chills, negative weight loss Eyes: negative diplopia or visual changes, negative eye pain ENT: negative coryza, negative sore throat Resp: negative cough, hemoptysis, dyspnea Cards: negative for chest pain, palpitations, lower extremity edema GU: negative for frequency, dysuria and hematuria GI: Negative for abdominal pain, diarrhea, bleeding, constipation Skin: negative for rash and pruritus Heme: negative for easy bruising and gum/nose bleeding MS: weakness, body ache. Mild  redness rt leg Neurolo: headaches, confusion Psych: negative for feelings of anxiety, depression  Endocrine: negative for thyroid , diabetes Allergy/Immunology- negative for any medication or food allergies ? Objective:  VITALS:  BP 127/86 (BP Location: Left Wrist)   Pulse 80   Temp 98.2 F (36.8 C) (Oral)   Resp 15   Ht 6' (1.829 m)   Wt 117.9 kg   SpO2 96%   BMI 35.25 kg/m   PHYSICAL EXAM:  General: Alert, cooperative, no distress, appears stated age.  Head: Normocephalic, without obvious abnormality, atraumatic. Eyes: Conjunctivae clear, anicteric sclerae. Pupils are equal ENT Nares normal. No drainage or sinus tenderness. Lips, mucosa, and tongue normal. No Thrush All teeth extracted except lower 4- po dentition Neck: Supple, symmetrical, no adenopathy, thyroid :  non tender no carotid bruit and no JVD. Back: No CVA tenderness. Lungs: Clear to auscultation bilaterally. No Wheezing or Rhonchi. No rales. Heart: Regular rate and rhythm, no murmur, rub or gallop. Abdomen: Soft, lap scar Extremities: both legs lymphedema Rt . Mild edema Heel  has many fissures        Skin: No rashes or lesions. Or bruising Lymph: Cervical, supraclavicular normal. Neurologic: Grossly non-focal Pertinent Labs Lab Results CBC    Component Value Date/Time   WBC 8.3 06/10/2024 1029   RBC 4.31 06/10/2024 1029   HGB 12.1 (L) 06/10/2024 1029   HGB 15.3 04/21/2014 0948   HCT 36.2 (L) 06/10/2024 1029   HCT 46.1 04/21/2014 0948   PLT 235 06/10/2024 1029   PLT 339 04/21/2014 0948   MCV 84.0 06/10/2024 1029   MCV 86 04/21/2014 0948   MCH 28.1 06/10/2024 1029   MCHC 33.4 06/10/2024 1029   RDW 13.5 06/10/2024 1029   RDW 14.1 04/21/2014 0948   LYMPHSABS 1.2 06/08/2024 1749   LYMPHSABS 2.5 04/21/2014 0948   MONOABS 1.3 (H) 06/08/2024 1749   MONOABS 0.7 04/21/2014 0948   EOSABS 0.0 06/08/2024 1749   EOSABS 0.0 04/21/2014 0948   BASOSABS 0.1 06/08/2024 1749   BASOSABS 0.1 04/21/2014 0948       Latest Ref Rng & Units 06/10/2024   10:29 AM 06/09/2024    6:27 AM 06/08/2024    5:49 PM  CMP  Glucose 70 - 99 mg/dL 879  856  872   BUN 6 - 20 mg/dL 14  17  18    Creatinine 0.61 - 1.24 mg/dL 9.09  8.99  8.96   Sodium 135 - 145 mmol/L 136  133  137   Potassium 3.5 - 5.1 mmol/L 3.5  3.3  3.9   Chloride 98 - 111 mmol/L 101  100  99   CO2 22 - 32 mmol/L 26  23  25    Calcium  8.9 - 10.3 mg/dL 8.2  8.0  8.9   Total Protein 6.5 - 8.1 g/dL  6.1  6.9   Total Bilirubin 0.0 - 1.2 mg/dL  0.7  0.8   Alkaline Phos 38 - 126 U/L  50  69   AST 15 - 41 U/L  52  65   ALT 0 - 44 U/L  42  55       Microbiology: Recent Results (from the past 240 hours)  Blood culture (single)     Status: None (Preliminary result)   Collection Time: 06/08/24  5:50 PM   Specimen: BLOOD  Result  Value Ref Range Status   Specimen Description BLOOD BLOOD RIGHT WRIST  Final   Special Requests   Final    BOTTLES DRAWN AEROBIC  AND ANAEROBIC Blood Culture adequate volume   Culture   Final    NO GROWTH 3 DAYS Performed at [facility], [address] [place], [place] [address]    Report Status PENDING  Incomplete  Blood culture (single)     Status: Abnormal   Collection Time: 06/08/24  7:01 PM   Specimen: BLOOD  Result Value Ref Range Status   Specimen Description   Final    BLOOD RIGHT ANTECUBITAL Performed at [facility], [address] [place], [place] [address]    Special Requests   Final    BOTTLES DRAWN AEROBIC AND ANAEROBIC Blood Culture results may not be optimal due to an inadequate volume of blood received in culture bottles Performed at [facility], [address] [place], [place] [address]    Culture  Setup Time   Final    GRAM POSITIVE COCCI IN BOTH AEROBIC AND ANAEROBIC BOTTLES CRITICAL RESULT CALLED TO, READ BACK BY AND VERIFIED WITH:  SHEEMA H. AT 1000 06/09/24 RAM Performed at [facility], [address], [place], [place] [address]    Culture GROUP B STREP(S.AGALACTIAE)ISOLATED (A)  Final   Report Status 06/11/2024 FINAL  Final   Organism ID, Bacteria GROUP B STREP(S.AGALACTIAE)ISOLATED  Final      Susceptibility   Group b strep(s.agalactiae)isolated - MIC*    CLINDAMYCIN >=1 RESISTANT Resistant     AMPICILLIN <=0.25 SENSITIVE Sensitive     ERYTHROMYCIN >=8 RESISTANT Resistant     VANCOMYCIN  0.5 SENSITIVE Sensitive     CEFTRIAXONE  <=0.12 SENSITIVE Sensitive     LEVOFLOXACIN 1 SENSITIVE Sensitive     PENICILLIN <=0.06 SENSITIVE Sensitive     * GROUP B STREP(S.AGALACTIAE)ISOLATED  Blood Culture ID Panel (Reflexed)     Status: Abnormal   Collection Time: 06/08/24  7:01 PM  Result Value Ref Range Status   Enterococcus faecalis NOT DETECTED NOT DETECTED Final   Enterococcus Faecium NOT DETECTED NOT DETECTED Final    Listeria monocytogenes NOT DETECTED NOT DETECTED Final   Staphylococcus species NOT DETECTED NOT DETECTED Final   Staphylococcus aureus (BCID) NOT DETECTED NOT DETECTED Final   Staphylococcus epidermidis NOT DETECTED NOT DETECTED Final   Staphylococcus lugdunensis NOT DETECTED NOT DETECTED Final   Streptococcus species DETECTED (A) NOT DETECTED Final    Comment: CRITICAL RESULT CALLED TO, READ BACK BY AND VERIFIED WITH: SHEEMA H. AT 1000 06/09/24 RAM    Streptococcus agalactiae DETECTED (A) NOT DETECTED Final    Comment: CRITICAL RESULT CALLED TO, READ BACK BY AND VERIFIED WITH: SHEEMA H. AT 1000 06/09/24 RAM    Streptococcus pneumoniae NOT DETECTED NOT DETECTED Final   Streptococcus pyogenes NOT DETECTED NOT DETECTED Final   A.calcoaceticus-baumannii NOT DETECTED NOT DETECTED Final   Bacteroides fragilis NOT DETECTED NOT DETECTED Final   Enterobacterales NOT DETECTED NOT DETECTED Final   Enterobacter cloacae complex NOT DETECTED NOT DETECTED Final   Escherichia coli NOT DETECTED NOT DETECTED Final   Klebsiella aerogenes NOT DETECTED NOT DETECTED Final   Klebsiella oxytoca NOT DETECTED NOT DETECTED Final   Klebsiella pneumoniae NOT DETECTED NOT DETECTED Final   Proteus species NOT DETECTED NOT DETECTED Final   Salmonella species NOT DETECTED NOT DETECTED Final   Serratia marcescens NOT DETECTED NOT DETECTED Final   Haemophilus influenzae NOT DETECTED NOT DETECTED Final   Neisseria meningitidis NOT DETECTED NOT DETECTED Final   Pseudomonas aeruginosa NOT DETECTED NOT DETECTED Final   Stenotrophomonas maltophilia NOT DETECTED NOT DETECTED Final   Candida  albicans NOT DETECTED NOT DETECTED Final   Candida auris NOT DETECTED NOT DETECTED Final   Candida glabrata NOT DETECTED NOT DETECTED Final   Candida krusei NOT DETECTED NOT DETECTED Final   Candida parapsilosis NOT DETECTED NOT DETECTED Final   Candida tropicalis NOT DETECTED NOT DETECTED Final   Cryptococcus neoformans/gattii NOT  DETECTED NOT DETECTED Final    Comment: Performed at [facility], [address] [place], [place] [address]  Resp panel by RT-PCR (RSV, Flu A&B, Covid) Anterior Nasal Swab     Status: None   Collection Time: 06/08/24 10:21 PM   Specimen: Anterior Nasal Swab  Result Value Ref Range Status   SARS Coronavirus 2 by RT PCR NEGATIVE NEGATIVE Final    Comment: (NOTE) SARS-CoV-2 target nucleic acids are NOT DETECTED.  The SARS-CoV-2 RNA is generally detectable in upper respiratory specimens during the acute phase of infection. The lowest concentration of SARS-CoV-2 viral copies this assay can detect is 138 copies/mL. A negative result does not preclude SARS-Cov-2 infection and should not be used as the sole basis for treatment or other patient management decisions. A negative result may occur with  improper specimen collection/handling, submission of specimen other than nasopharyngeal swab, presence of viral mutation(s) within the areas targeted by this assay, and inadequate number of viral copies(<138 copies/mL). A negative result must be combined with clinical observations, patient history, and epidemiological information. The expected result is Negative.  Fact Sheet for Patients:  BloggerCourse.com  Fact Sheet for Healthcare Providers:  SeriousBroker.it  This test is no t yet approved or cleared by the [place]  FDA and  has been authorized for detection and/or diagnosis of SARS-CoV-2 by FDA under an Emergency Use Authorization (EUA). This EUA will remain  in effect (meaning this test can be used) for the duration of the COVID-19 declaration under Section 564(b)(1) of the Act, 21 U.S.C.section 360bbb-3(b)(1), unless the authorization is terminated  or revoked sooner.       Influenza A by PCR NEGATIVE NEGATIVE Final   Influenza B by PCR NEGATIVE NEGATIVE Final    Comment: (NOTE) The Xpert Xpress SARS-CoV-2/FLU/RSV  plus assay is intended as an aid in the diagnosis of influenza from Nasopharyngeal swab specimens and should not be used as a sole basis for treatment. Nasal washings and aspirates are unacceptable for Xpert Xpress SARS-CoV-2/FLU/RSV testing.  Fact Sheet for Patients: BloggerCourse.com  Fact Sheet for Healthcare Providers: SeriousBroker.it  This test is not yet approved or cleared by the [place]  FDA and has been authorized for detection and/or diagnosis of SARS-CoV-2 by FDA under an Emergency Use Authorization (EUA). This EUA will remain in effect (meaning this test can be used) for the duration of the COVID-19 declaration under Section 564(b)(1) of the Act, 21 U.S.C. section 360bbb-3(b)(1), unless the authorization is terminated or revoked.     Resp Syncytial Virus by PCR NEGATIVE NEGATIVE Final    Comment: (NOTE) Fact Sheet for Patients: BloggerCourse.com  Fact Sheet for Healthcare Providers: SeriousBroker.it  This test is not yet approved or cleared by the [place]  FDA and has been authorized for detection and/or diagnosis of SARS-CoV-2 by FDA under an Emergency Use Authorization (EUA). This EUA will remain in effect (meaning this test can be used) for the duration of the COVID-19 declaration under Section 564(b)(1) of the Act, 21 U.S.C. section 360bbb-3(b)(1), unless the authorization is terminated or revoked.  Performed at [facility], [address], [place], [place] [address]   MRSA Next Gen by  PCR, Nasal     Status: None   Collection Time: 06/09/24 11:02 AM   Specimen: Nasal Mucosa; Nasal Swab  Result Value Ref Range Status   MRSA by PCR Next Gen NOT DETECTED NOT DETECTED Final    Comment: (NOTE) The GeneXpert MRSA Assay (FDA approved for NASAL specimens only), is one component of a comprehensive MRSA colonization surveillance program. It is  not intended to diagnose MRSA infection nor to guide or monitor treatment for MRSA infections. Test performance is not FDA approved in patients less than 48 years old. Performed at [facility], [address] [place], [place] [address]   Culture, blood (Routine X 2) w Reflex to ID Panel     Status: None (Preliminary result)   Collection Time: 06/09/24 10:45 PM   Specimen: BLOOD  Result Value Ref Range Status   Specimen Description BLOOD BLOOD LEFT HAND  Final   Special Requests   Final    BOTTLES DRAWN AEROBIC AND ANAEROBIC Blood Culture results may not be optimal due to an inadequate volume of blood received in culture bottles   Culture   Final    NO GROWTH 2 DAYS Performed at [facility], [address], [place], [place] [address]    Report Status PENDING  Incomplete  Culture, blood (Routine X 2) w Reflex to ID Panel     Status: None (Preliminary result)   Collection Time: 06/09/24 10:45 PM   Specimen: BLOOD  Result Value Ref Range Status   Specimen Description BLOOD BLOOD LEFT HAND  Final   Special Requests   Final    BOTTLES DRAWN AEROBIC AND ANAEROBIC Blood Culture adequate volume   Culture   Final    NO GROWTH 2 DAYS Performed at [facility], [address] [place], [place] [address]    Report Status PENDING  Incomplete      IMAGING RESULTS: Prominent periaortic lymph nodes on Ct abdomen I have personally reviewed the films ? 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

## 2024-06-11 NOTE — TOC CM/SW Note (Signed)
 Transition of Care [facility]) - Inpatient Brief Assessment   Patient Details  Name: KYRIAKOS BABLER MRN: [id number] Date of Birth: [birth year]  Transition of Care [facility]) CM/SW Contact:    Corean ONEIDA Haddock, RN Phone Number: 06/11/2024, 12:18 PM   Clinical Narrative:    Transition of Care [facility]) Screening Note   Patient Details  Name: KINGDAVID LEINBACH Date of Birth: [birth year]   Transition of Care [facility]) CM/SW Contact:    Corean ONEIDA Haddock, RN Phone Number: 06/11/2024, 12:18 PM    Transition of Care Department [facility]) has reviewed patient and no TOC needs have been identified at this time.  If new patient transition needs arise, please place a TOC consult.   Transition of Care Asessment: Insurance and Status: Insurance coverage has been reviewed Patient has primary care physician: Yes     Prior/Current Home Services: No current home services Social Drivers of Health Review: SDOH reviewed no interventions necessary Readmission risk has been reviewed: Yes Transition of care needs: no transition of care needs at this time

## 2024-06-12 ENCOUNTER — Telehealth: Payer: Self-pay | Admitting: *Deleted

## 2024-06-12 NOTE — Transitions of Care (Post Inpatient/ED Visit) (Signed)
 06/12/2024  Name: Wyatt Murray MRN: [id number] DOB: 27-Apr-1976  Today's TOC FU Call Status: Today's TOC FU Call Status:: Successful TOC FU Call Completed TOC FU Call Complete Date: 06/12/24 Patient's Name and Date of Birth confirmed.  Transition Care Management Follow-up Telephone Call Date of Discharge: 06/11/24 Discharge Facility: [facility]) Type of Discharge: Inpatient Admission Primary Inpatient Discharge Diagnosis:: Acute metabolic encephalopathy secondary to streptococcal bacteremia/ IBS How have you been since you were released from the hospital?: Better (I am doing fine; I no longer go to Dr. Joshua as my PCP- I go to [facility], Dr. Lamar Saba.  I have not seen Dr. Joshua in a very long time)  06/12/24: Reports no longer has [facility] PCP: Reports now has PCP at [facility], Dr. Saba Confirmed per [facility] Master Staff list [facility] Medical not included on practice call list Full TOC/ assessment deferred accordingly  Items Reviewed:    Medications Reviewed Today: Medications Reviewed Today     Reviewed by Brees Hounshell M, RN (Registered Nurse) on 06/12/24 at 1224  Med List Status: <None>   Medication Order Taking? Sig Documenting Provider Last Dose Status Informant  amoxicillin  (AMOXIL ) 500 MG capsule [id number]  Take 2 capsules (1,000 mg total) by mouth 3 (three) times daily for 8 days. Jens Durand, MD  Active   atorvastatin  (LIPITOR) 20 MG tablet [id number] No TAKE 1 TABLET(20 MG) BY MOUTH DAILY Joshua Debby CROME, MD 01/14/2023 0400 Active Self           Med Note GAYLENE DARVIN PARAS   Fri Jun 08, 2024 11:47 PM) Tolerates 20 mg dose  buprenorphine  (SUBUTEX ) 8 MG SUBL SL tablet [id number] No Place 8 mg under the tongue 2 (two) times daily. [provider] 06/08/2024 10:10 PM Active Self  diazepam  (VALIUM ) 5 MG tablet [id number] No Take 5 mg by mouth daily as needed. [provider] Unknown Active Self  dicyclomine  (BENTYL )  10 MG capsule [id number]  Take 1 capsule (10 mg total) by mouth 3 (three) times daily as needed (Stomach pain). Jens Durand, MD  Active   EPINEPHrine  (EPIPEN  2-PAK) 0.3 mg/0.3 mL IJ SOAJ injection [id number] No Inject 0.3 mg into the muscle as needed for anaphylaxis. Joshua Debby CROME, MD Taking Active Self  ergocalciferol (VITAMIN D2) 1.25 MG (50000 UT) capsule [id number] No Take 50,000 Units by mouth once a week. [provider] Past Week Active Self           Med Note (BEJOS, ANJA J   Fri Jun 08, 2024 11:48 PM) Sunday   lidocaine  (LIDODERM ) 5 % [id number] No Place 1 patch onto the skin daily as needed (pain). Remove & Discard patch within 12 hours or as directed by MD [provider] Past Week Active Self           Med Note GAYLENE, DARVIN PARAS   Fri Jun 08, 2024 11:51 PM) Not currently applied  metoprolol  succinate (TOPROL -XL) 50 MG 24 hr tablet [id number]  Take 1 tablet (50 mg total) by mouth daily. Take with or immediately following a meal. Joshua Debby CROME, MD  Active Self  Omega 3 1000 MG CAPS [id number] No Take 1,000 mg by mouth daily. [provider] Past Week Active Self  potassium chloride  SA (KLOR-CON  M15) 15 MEQ tablet [id number] No Take 1 tablet (15 mEq total) by mouth 2 (two) times daily. Joshua Debby CROME, MD 01/13/2023 Active Self  torsemide  (DEMADEX ) 10 MG tablet [id number] No Take 1 tablet (  10 mg total) by mouth daily. Joshua Debby CROME, MD 01/13/2023 Active Self            Home Care and Equipment/Supplies:    Functional Questionnaire:    Follow up appointments reviewed:    SDOH Interventions Today    Flowsheet Row Most Recent Value  SDOH Interventions   Food Insecurity Interventions Patient Declined  [Reports no longer has [facility] PCP]  Housing Interventions Patient Declined  [Reports no longer has [facility] PCP]  Transportation Interventions Patient Declined  [Reports no longer has [facility] PCP]  Utilities Interventions Patient Declined  [Reports no longer has [facility]  PCP]   Pls call/ message for questions,  Wyatt Segar Mckinney Emmaus Brandi, RN, BSN, Media planner  Transitions of Care  [facility]  [place] [phone]: direct office

## 2024-06-13 LAB — CULTURE, BLOOD (SINGLE)
Culture: NO GROWTH
Special Requests: ADEQUATE

## 2024-06-14 LAB — CULTURE, BLOOD (ROUTINE X 2)
Culture: NO GROWTH
Culture: NO GROWTH
Special Requests: ADEQUATE

## 2024-06-26 ENCOUNTER — Ambulatory Visit: Attending: Infectious Diseases | Admitting: Infectious Diseases

## 2024-06-26 ENCOUNTER — Other Ambulatory Visit
Admission: RE | Admit: 2024-06-26 | Discharge: 2024-06-26 | Disposition: A | Discharge status: Home / Self Care | Attending: Infectious Diseases | Admitting: Infectious Diseases

## 2024-06-26 ENCOUNTER — Encounter: Payer: Self-pay | Admitting: Infectious Diseases

## 2024-06-26 VITALS — BP 115/75 | HR 73 | Temp 98.5°F | Ht 72.0 in | Wt 245.0 lb

## 2024-06-26 DIAGNOSIS — B951 Streptococcus, group B, as the cause of diseases classified elsewhere: Secondary | ICD-10-CM | POA: Insufficient documentation

## 2024-06-26 DIAGNOSIS — R600 Localized edema: Secondary | ICD-10-CM | POA: Diagnosis not present

## 2024-06-26 DIAGNOSIS — Z22330 Carrier of Group B streptococcus: Secondary | ICD-10-CM | POA: Diagnosis not present

## 2024-06-26 DIAGNOSIS — R7881 Bacteremia: Secondary | ICD-10-CM | POA: Insufficient documentation

## 2024-06-26 DIAGNOSIS — B955 Unspecified streptococcus as the cause of diseases classified elsewhere: Secondary | ICD-10-CM | POA: Diagnosis present

## 2024-06-26 NOTE — Patient Instructions (Signed)
 VISIT SUMMARY:  You had a follow-up appointment after your recent hospitalization for a group B streptococcus infection. You completed your antibiotic treatment without any side effects. We discussed the condition of your feet, which have cracks and were previously swollen, and your ongoing issues with foot swelling.  YOUR PLAN:  -GROUP B STREPTOCOCCUS BACTEREMIA: This is a bacterial infection in the blood. You completed your antibiotic treatment successfully. A blood culture has been ordered to ensure the infection is fully resolved. If the culture is positive, further tests may be needed to check for heart involvement, although this is unlikely.  -CRACKED SKIN AND RESOLVED CELLULITIS OF BILATERAL FEET: You have cracks in the skin on both feet, and the previous infection in your right foot has resolved. To prevent future infections, continue moisturizing your feet with Vaseline and Cerave, especially at night, and use a pumice stone carefully. Apply triple antibiotic ointment and Band-Aids to any cracked areas as needed.  -CHRONIC LOWER EXTREMITY EDEMA: This is ongoing swelling in your lower legs and feet. To manage this, continue taking your diuretics and make lifestyle changes. Wear compression socks during the day and remove them at night. Apply Vaseline to your feet before bed.  INSTRUCTIONS:  Please follow up with a blood culture to confirm the resolution of the bacteremia. Continue with the moisturizing and protective measures for your feet, and wear compression socks during the day to manage the swelling in your legs.

## 2024-06-26 NOTE — Progress Notes (Signed)
 NAME: Wyatt Murray  DOB: 11-Apr-1976  MRN: [id number]  Date/Time: 06/26/2024 10:28 AM  Subjective:  Follow up after hospitalization 9/12-9/15 He consented to the use of AI scribe ?Wyatt Murray is a 48 year old male who presents for follow-up after treatment for group B streptococcus bacteremia  He was hospitalized 9/12-9/15 and was on Iv antibiotics and discharged on PO amoxicillin  1 gram TID to complete total of 10 days of antibiotic. Which he completed on 06/18/24 HE is doing so much better  . He was hospitalized after developing chills and confusion, and he does not recall the events leading up to his hospitalization, only waking up in the hospital the following day. He experienced no adverse effects from the medication, such as nausea, vomiting, diarrhea, or skin rash.  He had a crack on his right foot, which was swollen to the point that he could not wear shoes. And that likely was the source of the bacteremia It is much better now He has been using a cream for dry and cracked heels and a pumice stone to manage the condition. Both feet have developed cracks, and he uses Neosporin and Band-Aids to cover them. The right foot remains tender.  He experiences regular edema in his feet, which he manages with torsemide . He has not been using compression socks, and his feet were significantly more swollen during the infection. His medication regimen includes torsemide , metoprolol , atorvastatin , buprenorphine , Valium  as needed, vitamin D, and fish oil. He has a history of right renal cryoablation, hernia surgery, and right bundle branch block, which was deemed not concerning by a cardiologist.  Past Medical History:  Diagnosis Date   Anxiety    Blood in stool    GERD (gastroesophageal reflux disease)    Hyperlipidemia    Hypertension    Pre-diabetes    Substance abuse (HCC)    pain meds    Past Surgical History:  Procedure Laterality Date   APPENDECTOMY     BOWEL RESECTION      HERNIA REPAIR     Inguinal x 10 surgeries with strangulated bowel resected   RADIOLOGY WITH ANESTHESIA Right 01/14/2023   Procedure: Right renal cryoablation;  Surgeon: Johann Sieving, MD;  Location: [facility] OR;  Service: Radiology;  Laterality: Right;    Social History   Socioeconomic History   Marital status: Divorced    Spouse name: Not on file   Number of children: Not on file   Years of education: Not on file   Highest education level: Not on file  Occupational History   Not on file  Tobacco Use   Smoking status: Former    Current packs/day: 0.00    Types: Cigarettes    Quit date: 11/02/2021    Years since quitting: 2.6    Passive exposure: Current   Smokeless tobacco: Current   Tobacco comments:    Uses zyn  Vaping Use   Vaping status: Every Day   Substances: Nicotine   Substance and Sexual Activity   Alcohol use: Not Currently    Comment: 01/12/23   Drug use: Not Currently   Sexual activity: Yes    Partners: Female  Other Topics Concern   Not on file  Social History Narrative   Lives with fiance.  One child age 29.     Social Drivers of Corporate investment banker Strain: Low Risk  (10/06/2018)   Received from [facility]   Overall Financial Resource Strain (CARDIA)    Difficulty of Paying  Living Expenses: Not hard at all  Food Insecurity: Patient Declined (06/12/2024)   Hunger Vital Sign    Worried About Running Out of Food in the Last Year: Patient declined    Ran Out of Food in the Last Year: Patient declined  Transportation Needs: Patient Declined (06/12/2024)   PRAPARE - Administrator, Civil Service (Medical): Patient declined    Lack of Transportation (Non-Medical): Patient declined  Physical Activity: Inactive (10/06/2018)   Received from [facility]   Exercise Vital Sign    Days of Exercise per Week: 0 days    Minutes of Exercise per Session: 0 min  Stress: No Stress Concern Present (10/06/2018)   Received from [facility] of Occupational Health - Occupational Stress Questionnaire    Feeling of Stress : Not at all  Social Connections: Unknown (10/06/2018)   Received from [facility]   Social Connection and Isolation Panel    Frequency of Communication with Friends and Family: Three times a week    Frequency of Social Gatherings with Friends and Family: Twice a week    Attends Religious Services: Patient declined    Database administrator or Organizations: No    Attends Engineer, structural: Not asked    Marital Status: Separated  Intimate Partner Violence: Patient Declined (06/12/2024)   Humiliation, Afraid, Rape, and Kick questionnaire    Fear of Current or Ex-Partner: Patient declined    Emotionally Abused: Patient declined    Physically Abused: Patient declined    Sexually Abused: Patient declined    Family History  Problem Relation Age of Onset   Stroke Mother    Hypertension Mother    Hyperlipidemia Mother    Heart disease Mother        CABG   Diabetes Mother    Arthritis Mother    Cancer Father        throat   Arthritis Father    Alcohol abuse Father    Hypertension Paternal Grandfather    Hyperlipidemia Paternal Grandfather    Cancer Paternal Grandfather        pituatary, throat   Alcohol abuse Paternal Grandfather    Allergies  Allergen Reactions   Iodinated Contrast Media Shortness Of Breath and Rash    IV dye   Iodine Anaphylaxis   Midazolam Other (See Comments)    VERY ANGRY, AGITATED, TAKES OUT IV,   Shellfish Allergy Anaphylaxis   Atorvastatin  Other (See Comments)    Elevated liver enzymes   I? Current Outpatient Medications  Medication Sig Dispense Refill   atorvastatin  (LIPITOR) 20 MG tablet TAKE 1 TABLET(20 MG) BY MOUTH DAILY 90 tablet 1   buprenorphine  (SUBUTEX ) 8 MG SUBL SL tablet Place 8 mg under the tongue 2 (two) times daily.     diazepam  (VALIUM ) 5 MG tablet Take 5 mg by mouth daily as needed.     dicyclomine  (BENTYL ) 10 MG capsule Take 1  capsule (10 mg total) by mouth 3 (three) times daily as needed (Stomach pain).     EPINEPHrine  (EPIPEN  2-PAK) 0.3 mg/0.3 mL IJ SOAJ injection Inject 0.3 mg into the muscle as needed for anaphylaxis. 1 each 3   ergocalciferol (VITAMIN D2) 1.25 MG (50000 UT) capsule Take 50,000 Units by mouth once a week.     lidocaine  (LIDODERM ) 5 % Place 1 patch onto the skin daily as needed (pain). Remove & Discard patch within 12 hours or as directed by MD  metoprolol  succinate (TOPROL -XL) 50 MG 24 hr tablet Take 1 tablet (50 mg total) by mouth daily. Take with or immediately following a meal. 30 tablet 0   Omega 3 1000 MG CAPS Take 1,000 mg by mouth daily.     potassium chloride  SA (KLOR-CON  M15) 15 MEQ tablet Take 1 tablet (15 mEq total) by mouth 2 (two) times daily. 180 tablet 1   torsemide  (DEMADEX ) 10 MG tablet Take 1 tablet (10 mg total) by mouth daily. 90 tablet 1   No current facility-administered medications for this visit.     Abtx:  Anti-infectives (From admission, onward)    None       REVIEW OF SYSTEMS:  Const: negative fever, negative chills, negative weight loss Eyes: negative diplopia or visual changes, negative eye pain ENT: negative coryza, negative sore throat Resp: negative cough, hemoptysis, dyspnea Cards: negative for chest pain, palpitations, lower extremity edema GU: negative for frequency, dysuria and hematuria GI: Negative for abdominal pain, diarrhea, bleeding, constipation Skin:cracked heel b/l Heme: negative for easy bruising and gum/nose bleeding MS: negative for myalgias, arthralgias, back pain and muscle weakness Neurolo:negative for headaches, dizziness, vertigo, memory problems  Psych: negative for feelings of anxiety, depression  Endocrine: negative for thyroid , diabetes Allergy/Immunology- as above Objective:  VITALS:  BP 115/75   Pulse 73   Temp 98.5 F (36.9 C) (Oral)   Ht 6' (1.829 m)   Wt 245 lb (111.1 kg)   SpO2 95%   BMI 33.23 kg/m    PHYSICAL EXAM:  General: Alert, cooperative, no distress, appears stated age.  Head: Normocephalic, without obvious abnormality, atraumatic. Eyes: Conjunctivae clear, anicteric sclerae. Pupils are equal ENT Nares normal. No drainage or sinus tenderness. Lips, mucosa, and tongue normal. No Thrush Neck: Supple, symmetrical, no adenopathy, thyroid : non tender no carotid bruit and no JVD. Back: No CVA tenderness. Lungs: Clear to auscultation bilaterally. No Wheezing or Rhonchi. No rales. Heart: Regular rate and rhythm, no murmur, rub or gallop. Abdomen: Soft, non-tender,not distended. Bowel sounds normal. No masses Extremities:edema legs Rt>left Crack heel  Skin: No rashes or lesions. Or bruising Lymph: Cervical, supraclavicular normal. Neurologic: Grossly non-focal Pertinent Labs Lab Results CBC    Component Value Date/Time   WBC 8.3 06/10/2024 1029   RBC 4.31 06/10/2024 1029   HGB 12.1 (L) 06/10/2024 1029   HGB 15.3 04/21/2014 0948   HCT 36.2 (L) 06/10/2024 1029   HCT 46.1 04/21/2014 0948   PLT 235 06/10/2024 1029   PLT 339 04/21/2014 0948   MCV 84.0 06/10/2024 1029   MCV 86 04/21/2014 0948   MCH 28.1 06/10/2024 1029   MCHC 33.4 06/10/2024 1029   RDW 13.5 06/10/2024 1029   RDW 14.1 04/21/2014 0948   LYMPHSABS 1.2 06/08/2024 1749   LYMPHSABS 2.5 04/21/2014 0948   MONOABS 1.3 (H) 06/08/2024 1749   MONOABS 0.7 04/21/2014 0948   EOSABS 0.0 06/08/2024 1749   EOSABS 0.0 04/21/2014 0948   BASOSABS 0.1 06/08/2024 1749   BASOSABS 0.1 04/21/2014 0948       Latest Ref Rng & Units 06/10/2024   10:29 AM 06/09/2024    6:27 AM 06/08/2024    5:49 PM  CMP  Glucose 70 - 99 mg/dL 879  856  872   BUN 6 - 20 mg/dL 14  17  18    Creatinine 0.61 - 1.24 mg/dL 9.09  8.99  8.96   Sodium 135 - 145 mmol/L 136  133  137   Potassium 3.5 - 5.1 mmol/L 3.5  3.3  3.9   Chloride 98 - 111 mmol/L 101  100  99   CO2 22 - 32 mmol/L 26  23  25    Calcium  8.9 - 10.3 mg/dL 8.2  8.0  8.9   Total  Protein 6.5 - 8.1 g/dL  6.1  6.9   Total Bilirubin 0.0 - 1.2 mg/dL  0.7  0.8   Alkaline Phos 38 - 126 U/L  50  69   AST 15 - 41 U/L  52  65   ALT 0 - 44 U/L  42  55    ? Impression/Recommendation Group B streptococcus bacteremia   He completed antibiotic therapy with amoxicillin  without adverse reactions. A blood culture is ordered to confirm resolution of the bacteremia.  He feels well and his vital signs are stable, reducing the likelihood of persistent infection.  Cracked skin and resolved cellulitis of rt leg Cracked skin on both feet with resolved cellulitis. The right foot was previously swollen and tender, likely the source of the initial infection. Current management includes moisturizing and protective measures to prevent recurrence of infection. There is a risk of recurrent bacteremia if the skin cracks are not properly managed. He should use Vaseline and Cerave for moisturizing, especially at night, . Apply triple antibiotic ointment and Band-Aids to affected areas as needed.  Chronic lower extremity edema   Chronic edema of the lower extremities with recent significant swelling noted during hospitalization. Current management includes diuretics and lifestyle modifications. He should wear compression socks during the day and remove them at night, applying Vaseline before bed.? ?  ________________________________________________ Discussed with patient in detail Will let him know of the culture result Follow PRN

## 2024-07-01 LAB — CULTURE, BLOOD (ROUTINE X 2)
Culture: NO GROWTH
Culture: NO GROWTH
Special Requests: ADEQUATE
Special Requests: ADEQUATE
